# Patient Record
Sex: Male | Born: 1990 | Race: Black or African American | Hispanic: No | Marital: Single | Smoking: Current every day smoker
Health system: Southern US, Community
[De-identification: ages and names within clinical notes are randomized; demographics above are authoritative.]

## PROBLEM LIST (undated history)

## (undated) DIAGNOSIS — F329 Major depressive disorder, single episode, unspecified: Secondary | ICD-10-CM

## (undated) DIAGNOSIS — F23 Brief psychotic disorder: Secondary | ICD-10-CM

## (undated) DIAGNOSIS — J45909 Unspecified asthma, uncomplicated: Secondary | ICD-10-CM

---

## 2015-09-05 ENCOUNTER — Emergency Department (INDEPENDENT_AMBULATORY_CARE_PROVIDER_SITE_OTHER)
Admission: EM | Admit: 2015-09-05 | Discharge: 2015-09-05 | Disposition: A | Discharge status: Home / Self Care | Payer: Self-pay | Source: Home / Self Care

## 2015-09-05 ENCOUNTER — Encounter (HOSPITAL_COMMUNITY): Payer: Self-pay | Admitting: Emergency Medicine

## 2015-09-05 ENCOUNTER — Emergency Department (INDEPENDENT_AMBULATORY_CARE_PROVIDER_SITE_OTHER): Payer: Self-pay

## 2015-09-05 DIAGNOSIS — S8392XA Sprain of unspecified site of left knee, initial encounter: Secondary | ICD-10-CM

## 2015-09-05 HISTORY — DX: Unspecified asthma, uncomplicated: J45.909

## 2015-09-05 MED ORDER — IBUPROFEN 800 MG PO TABS
800.0000 mg | ORAL_TABLET | Freq: Once | ORAL | Status: AC
  Administered 2015-09-05: 800 mg via ORAL

## 2015-09-05 MED ORDER — IBUPROFEN 800 MG PO TABS
ORAL_TABLET | ORAL | Status: AC
  Filled 2015-09-05: qty 1

## 2015-09-05 NOTE — ED Notes (Signed)
Left knee pain and swelling.  Reports falling on concrete while skateboarding yesterday.

## 2015-09-05 NOTE — Discharge Instructions (Signed)
Knee Sprain °A knee sprain is a tear in the strong bands of tissue that connect the bones (ligaments) of your knee. °HOME CARE °· Raise (elevate) your injured knee to lessen puffiness (swelling). °· To ease pain and puffiness, put ice on the injured area. °¨ Put ice in a plastic bag. °¨ Place a towel between your skin and the bag. °¨ Leave the ice on for 20 minutes, 2-3 times a day. °· Only take medicine as told by your doctor. °· Do not leave your knee unprotected until pain and stiffness go away (usually 4-6 weeks). °· If you have a cast or splint, do not get it wet. If your doctor told you to not take it off, cover it with a plastic bag when you shower or bathe. Do not swim. °· Your doctor may have you do exercises to prevent or limit permanent weakness and stiffness. °GET HELP RIGHT AWAY IF:  °· Your cast or splint becomes damaged. °· Your pain gets worse. °· You have a lot of pain, puffiness, or numbness below the cast or splint. °MAKE SURE YOU:  °· Understand these instructions. °· Will watch your condition. °· Will get help right away if you are not doing well or get worse. °  °This information is not intended to replace advice given to you by your health care provider. Make sure you discuss any questions you have with your health care provider. °  °Document Released: 09/24/2009 Document Revised: 10/11/2013 Document Reviewed: 06/14/2013 °Elsevier Interactive Patient Education ©2016 Elsevier Inc. ° °

## 2015-09-05 NOTE — ED Provider Notes (Signed)
CSN: [id number]     Arrival date & time 09/05/15  1359 History   None    No chief complaint on file.  (Consider location/radiation/quality/duration/timing/severity/associated sxs/prior Treatment) HPI History obtained from patient:   LOCATION: left knee SEVERITY: DURATION:yesterday CONTEXT:fall while skating QUALITY: MODIFYING FACTORS:ice packs ASSOCIATED SYMPTOMS:none TIMING:constant OCCUPATION: student  No past medical history on file. No past surgical history on file. No family history on file. Social History  Substance Use Topics  . Smoking status: Not on file  . Smokeless tobacco: Not on file  . Alcohol Use: Not on file    Review of Systems ROS +'ve left knee pain  Denies: HEADACHE, NAUSEA, ABDOMINAL PAIN, CHEST PAIN, CONGESTION, DYSURIA, SHORTNESS OF BREATH  Allergies  Review of patient's allergies indicates not on file.  Home Medications   Prior to Admission medications   Not on File   Meds Ordered and Administered this Visit  Medications - No data to display  BP 110/58 mmHg  Pulse 71  Temp(Src) 97.9 F (36.6 C) (Oral)  Resp 16  SpO2 99% No data found.   Physical Exam  Musculoskeletal: Normal range of motion. He exhibits tenderness.       Left knee: He exhibits normal range of motion, no effusion, no erythema and no bony tenderness. Tenderness found. Medial joint line tenderness noted.  Nursing note and vitals reviewed.   ED Course  Procedures (including critical care time)  Labs Review Labs Reviewed - No data to display  Imaging Review Dg Knee Complete 4 Views Left  09/05/2015  CLINICAL DATA:  Pt fell 1 day ago onto left knee; He complains of pain medial left knee; Ambulatory with limp EXAM: LEFT KNEE - COMPLETE 4+ VIEW COMPARISON:  None. FINDINGS: No fracture of the proximal tibia or distal femur. Patella is normal. No joint effusion. IMPRESSION: No fracture or dislocation. Electronically Signed   By: Genevive BiStewart  Edmunds M.D.   On: 09/05/2015  16:14     Visual Acuity Review  Right Eye Distance:   Left Eye Distance:   Bilateral Distance:    Right Eye Near:   Left Eye Near:    Bilateral Near:         MDM   1. Knee sprain and strain, left, initial encounter    Independent review of the left knee x-ray does not reveal fracture. Suggest symptomatic treatment. I have applied a wrap to the knee. Crutches of care provider discharged home in stable condition.  THIS NOTE WAS GENERATED USING A VOICE RECOGNITION SOFTWARE PROGRAM. ALL REASONABLE EFFORTS  WERE MADE TO PROOFREAD THIS DOCUMENT FOR ACCURACY.     Tharon AquasFrank C Patrick, PA 09/05/15 [phone]

## 2015-09-12 ENCOUNTER — Emergency Department (HOSPITAL_COMMUNITY)
Admission: EM | Admit: 2015-09-12 | Discharge: 2015-09-12 | Disposition: A | Discharge status: Home / Self Care | Payer: Self-pay | Attending: Emergency Medicine | Admitting: Emergency Medicine

## 2015-09-12 ENCOUNTER — Encounter (HOSPITAL_COMMUNITY): Payer: Self-pay | Admitting: Emergency Medicine

## 2015-09-12 DIAGNOSIS — J45909 Unspecified asthma, uncomplicated: Secondary | ICD-10-CM | POA: Insufficient documentation

## 2015-09-12 DIAGNOSIS — M25562 Pain in left knee: Secondary | ICD-10-CM | POA: Insufficient documentation

## 2015-09-12 DIAGNOSIS — Z79899 Other long term (current) drug therapy: Secondary | ICD-10-CM | POA: Insufficient documentation

## 2015-09-12 DIAGNOSIS — R200 Anesthesia of skin: Secondary | ICD-10-CM | POA: Insufficient documentation

## 2015-09-12 MED ORDER — MELOXICAM 7.5 MG PO TABS: 7.5000 mg | ORAL_TABLET | Freq: Every day | ORAL | Status: DC

## 2015-09-12 NOTE — ED Notes (Signed)
Pt injured left knee while roller skating last week. [facility] , had neg xray and given Ibuprofen. Pt returns today for continued pain. Small scab on left knee w/o sx of infection. No other deformity.

## 2015-09-12 NOTE — ED Provider Notes (Signed)
CSN: [id number]     Arrival date & time 09/12/15  1121 History  By signing my name below, I, Freida Busman, attest that this documentation has been prepared under the direction and in the presence of non-physician practitioner, Trixie Dredge, PA-C. Electronically Signed: Freida Busman, Scribe. 09/12/2015. 1:17 PM.    Chief Complaint  Patient presents with  . Knee Pain   The history is provided by the patient. No language interpreter was used.    HPI Comments:  Sean Sanford is a 24 y.o. male who presents to the Emergency Department complaining of moderate continued left knee pain following injury ~ 1 week ago. He states he was skateboarding when he went off a curb.  His legs spread apart and the left knee bent inward. He was evaluated at urgent care following injury and had negative XR. He notes associated numbness below site which began after his urgent care visit. He has been taking aleve and ibuprofen without relief. He has also been wearing a brace which has provided little relief. He denies fever, weakness of his LLE and hip, ankle, or foot pain.   Past Medical History  Diagnosis Date  . Asthma    History reviewed. No pertinent past surgical history. No family history on file. Social History  Substance Use Topics  . Smoking status: Never Smoker   . Smokeless tobacco: None  . Alcohol Use: No    Review of Systems  Constitutional: Negative for fever and chills.  Respiratory: Negative for shortness of breath.   Cardiovascular: Negative for chest pain.  Musculoskeletal: Positive for myalgias and arthralgias.  Skin: Negative for color change and pallor.  Allergic/Immunologic: Negative for immunocompromised state.  Neurological: Positive for numbness. Negative for weakness.  Hematological: Does not bruise/bleed easily.   Allergies  Review of patient's allergies indicates no known allergies.  Home Medications   Prior to Admission medications   Medication Sig Start Date End Date  Taking? Authorizing Provider  albuterol (PROVENTIL HFA;VENTOLIN HFA) 108 (90 BASE) MCG/ACT inhaler Inhale into the lungs every 6 (six) hours as needed for wheezing or shortness of breath.    Historical Provider, MD  FLUoxetine HCl (PROZAC PO) Take by mouth.    Historical Provider, MD  meloxicam (MOBIC) 7.5 MG tablet Take 1 tablet (7.5 mg total) by mouth daily. 09/12/15   Trixie Dredge, PA-C   BP 131/77 mmHg  Pulse 67  Temp(Src) 97.6 F (36.4 C) (Oral)  Resp 18  SpO2 100% Physical Exam  Constitutional: He appears well-developed and well-nourished. No distress.  HENT:  Head: Normocephalic and atraumatic.  Neck: Neck supple.  Pulmonary/Chest: Effort normal.  Musculoskeletal:       Legs: Left calf non-tender No tenderness of left patella No erythema or warmth associated with joint Intact scab over medial aspect of left knee; no induration, fluctuance associated; no discharge No pain or laxity with valgus/varus stressing Mild edema to anterior left shin  LLE Extends to 170 degrees; flexes to 90 degrees  Able to ambulate with limp Compartments soft.  Thessaly test negative  Neurological: He is alert.  Skin: He is not diaphoretic.  Psychiatric: He has a normal mood and affect.  Nursing note and vitals reviewed.   ED Course  Procedures   DIAGNOSTIC STUDIES:  Oxygen Saturation is 99% on RA, normal by my interpretation.    COORDINATION OF CARE:  1:00 PM Discussed treatment plan with pt at bedside and pt agreed to plan.    MDM   Final diagnoses:  Left  knee pain    Afebrile, nontoxic patient with injury to his left knee while skateboarding and persistent pain x 1 week.   Xray performed at urgent care negative.  Unclear on testing what is causing his persistent pain.  D/C home with mobic, orthopedic follow up.  Discussed result, findings, treatment, and follow up  with patient.  Pt given return precautions.  Pt verbalizes understanding and agrees with plan.      I doubt any  other EMC precluding discharge at this time including, but not necessarily limited to the following: septic joint, occult fracture, compartment syndrome   I personally performed the services described in this documentation, which was scribed in my presence. The recorded information has been reviewed and is accurate.   Trixie Dredgemily Sharonda Llamas, PA-C 09/12/15 1607  Margarita Grizzleanielle Ray, MD 09/12/15 [phone]

## 2015-09-12 NOTE — Discharge Instructions (Signed)
Read the information below.  Use the prescribed medication as directed.  Please discuss all new medications with your pharmacist.  You may return to the Emergency Department at any time for worsening condition or any new symptoms that concern you.   Do not take aleve or ibuprofen while taking this medication.  If you develop uncontrolled pain, weakness or numbness of the extremity, severe discoloration of the skin, fevers, or you are unable to walk, return to the ER for a recheck.      Knee Pain Knee pain is a very common symptom and can have many causes. Knee pain often goes away when you follow your health care provider's instructions for relieving pain and discomfort at home. However, knee pain can develop into a condition that needs treatment. Some conditions may include:  Arthritis caused by wear and tear (osteoarthritis).  Arthritis caused by swelling and irritation (rheumatoid arthritis or gout).  A cyst or growth in your knee.  An infection in your knee joint.  An injury that will not heal.  Damage, swelling, or irritation of the tissues that support your knee (torn ligaments or tendinitis). If your knee pain continues, additional tests may be ordered to diagnose your condition. Tests may include X-rays or other imaging studies of your knee. You may also need to have fluid removed from your knee. Treatment for ongoing knee pain depends on the cause, but treatment may include:  Medicines to relieve pain or swelling.  Steroid injections in your knee.  Physical therapy.  Surgery. HOME CARE INSTRUCTIONS  Take medicines only as directed by your health care provider.  Rest your knee and keep it raised (elevated) while you are resting.  Do not do things that cause or worsen pain.  Avoid high-impact activities or exercises, such as running, jumping rope, or doing jumping jacks.  Apply ice to the knee area:  Put ice in a plastic bag.  Place a towel between your skin and the  bag.  Leave the ice on for 20 minutes, 2-3 times a day.  Ask your health care provider if you should wear an elastic knee support.  Keep a pillow under your knee when you sleep.  Lose weight if you are overweight. Extra weight can put pressure on your knee.  Do not use any tobacco products, including cigarettes, chewing tobacco, or electronic cigarettes. If you need help quitting, ask your health care provider. Smoking may slow the healing of any bone and joint problems that you may have. SEEK MEDICAL CARE IF:  Your knee pain continues, changes, or gets worse.  You have a fever along with knee pain.  Your knee buckles or locks up.  Your knee becomes more swollen. SEEK IMMEDIATE MEDICAL CARE IF:   Your knee joint feels hot to the touch.  You have chest pain or trouble breathing.   This information is not intended to replace advice given to you by your health care provider. Make sure you discuss any questions you have with your health care provider.   Document Released: 08/03/2007 Document Revised: 10/27/2014 Document Reviewed: 05/22/2014 Elsevier Interactive Patient Education [employer]2016 Elsevier Inc.

## 2016-11-09 ENCOUNTER — Emergency Department (HOSPITAL_COMMUNITY)
Admission: EM | Admit: 2016-11-09 | Discharge: 2016-11-09 | Disposition: A | Discharge status: Home / Self Care | Payer: Medicaid - Out of State | Attending: Emergency Medicine | Admitting: Emergency Medicine

## 2016-11-09 ENCOUNTER — Emergency Department (HOSPITAL_COMMUNITY): Payer: Medicaid - Out of State

## 2016-11-09 ENCOUNTER — Encounter (HOSPITAL_COMMUNITY): Payer: Self-pay | Admitting: Emergency Medicine

## 2016-11-09 DIAGNOSIS — N50811 Right testicular pain: Secondary | ICD-10-CM | POA: Insufficient documentation

## 2016-11-09 DIAGNOSIS — R1900 Intra-abdominal and pelvic swelling, mass and lump, unspecified site: Secondary | ICD-10-CM | POA: Insufficient documentation

## 2016-11-09 DIAGNOSIS — J45909 Unspecified asthma, uncomplicated: Secondary | ICD-10-CM | POA: Diagnosis not present

## 2016-11-09 DIAGNOSIS — R059 Cough, unspecified: Secondary | ICD-10-CM

## 2016-11-09 DIAGNOSIS — Z79899 Other long term (current) drug therapy: Secondary | ICD-10-CM | POA: Diagnosis not present

## 2016-11-09 DIAGNOSIS — R05 Cough: Secondary | ICD-10-CM | POA: Diagnosis not present

## 2016-11-09 DIAGNOSIS — R1909 Other intra-abdominal and pelvic swelling, mass and lump: Secondary | ICD-10-CM

## 2016-11-09 DIAGNOSIS — N50819 Testicular pain, unspecified: Secondary | ICD-10-CM

## 2016-11-09 LAB — CBC WITH DIFFERENTIAL/PLATELET
Basophils Absolute: 0 10*3/uL (ref 0.0–0.1)
Basophils Relative: 1 %
EOS ABS: 0.2 10*3/uL (ref 0.0–0.7)
Eosinophils Relative: 3 %
HCT: 39.5 % (ref 39.0–52.0)
HEMOGLOBIN: 13.8 g/dL (ref 13.0–17.0)
LYMPHS ABS: 3.3 10*3/uL (ref 0.7–4.0)
LYMPHS PCT: 65 %
MCH: 31.5 pg (ref 26.0–34.0)
MCHC: 34.9 g/dL (ref 30.0–36.0)
MCV: 90.2 fL (ref 78.0–100.0)
MONOS PCT: 10 %
Monocytes Absolute: 0.5 10*3/uL (ref 0.1–1.0)
Neutro Abs: 1.1 10*3/uL — ABNORMAL LOW (ref 1.7–7.7)
Neutrophils Relative %: 21 %
Platelets: 239 10*3/uL (ref 150–400)
RBC: 4.38 MIL/uL (ref 4.22–5.81)
RDW: 11.9 % (ref 11.5–15.5)
WBC: 5 10*3/uL (ref 4.0–10.5)

## 2016-11-09 LAB — URINALYSIS, ROUTINE W REFLEX MICROSCOPIC
BILIRUBIN URINE: NEGATIVE
Bacteria, UA: NONE SEEN
GLUCOSE, UA: NEGATIVE mg/dL
Hgb urine dipstick: NEGATIVE
Ketones, ur: NEGATIVE mg/dL
LEUKOCYTES UA: NEGATIVE
NITRITE: NEGATIVE
PH: 5 (ref 5.0–8.0)
Protein, ur: 30 mg/dL — AB
SPECIFIC GRAVITY, URINE: 1.028 (ref 1.005–1.030)
Squamous Epithelial / LPF: NONE SEEN

## 2016-11-09 LAB — COMPREHENSIVE METABOLIC PANEL
ALK PHOS: 46 U/L (ref 38–126)
ALT: 33 U/L (ref 17–63)
ANION GAP: 9 (ref 5–15)
AST: 40 U/L (ref 15–41)
Albumin: 4.1 g/dL (ref 3.5–5.0)
BILIRUBIN TOTAL: 0.9 mg/dL (ref 0.3–1.2)
BUN: 16 mg/dL (ref 6–20)
CALCIUM: 9.6 mg/dL (ref 8.9–10.3)
CO2: 25 mmol/L (ref 22–32)
CREATININE: 1.16 mg/dL (ref 0.61–1.24)
Chloride: 105 mmol/L (ref 101–111)
Glucose, Bld: 86 mg/dL (ref 65–99)
Potassium: 3.7 mmol/L (ref 3.5–5.1)
SODIUM: 139 mmol/L (ref 135–145)
TOTAL PROTEIN: 6.9 g/dL (ref 6.5–8.1)

## 2016-11-09 MED ORDER — ALBUTEROL SULFATE HFA 108 (90 BASE) MCG/ACT IN AERS: 2.0000 | INHALATION_SPRAY | RESPIRATORY_TRACT | 0 refills | Status: DC | PRN

## 2016-11-09 MED ORDER — PREDNISONE 10 MG (21) PO TBPK: ORAL_TABLET | ORAL | 0 refills | Status: DC

## 2016-11-09 MED ORDER — BENZONATATE 100 MG PO CAPS: 100.0000 mg | ORAL_CAPSULE | Freq: Three times a day (TID) | ORAL | 0 refills | Status: DC

## 2016-11-09 MED ORDER — IBUPROFEN 800 MG PO TABS
800.0000 mg | ORAL_TABLET | Freq: Three times a day (TID) | ORAL | 0 refills | Status: DC
Start: 2016-11-09 — End: 2019-04-06

## 2016-11-09 NOTE — Discharge Instructions (Signed)
There are no abnormalities noted on the scrotal ultrasound. The mass noted on exam will need to be followed up with by urology. Call the number provided to set up an appointment. Ibuprofen, naproxen, or Tylenol for pain.  For your cough, Tessalon should provide some comfort. Take the prednisone as directed. Use the albuterol inhaler as needed.

## 2016-11-09 NOTE — ED Triage Notes (Signed)
Patient reports right testicular pain with mild swelling onset this week , denies injury , no dysuria or fever .

## 2016-11-09 NOTE — ED Notes (Signed)
Pt to ultrasound

## 2016-11-09 NOTE — ED Notes (Signed)
Called patient from waiting room.  Tech 1st stated that he was taken to xray, she then called portable xray # and requested patient be taken to B19 upon their return.

## 2016-11-09 NOTE — ED Provider Notes (Signed)
[facility]-EMERGENCY DEPT Provider Note   CSN: [id number] Arrival date & time: 11/09/16  0051     History   Chief Complaint Chief Complaint  Patient presents with  . Testicle Pain    HPI Sean Sanford is a 26 y.o. male.  HPI   Sean Sanford is a 26 y.o. male, with a history of Asthma, presenting to the ED with swelling to the right side of his genitals that began 2-3 days ago. Swelling came on gradually. Has not happened before. Denies pain in the region.  Patient also complains of some increased wheezing and cough over the past few days. Has been using his albuterol inhaler with relief. Denies shortness of breath and chest pain.  Denies fever/chills, penile discharge, testicular pain, urinary complaints, abdominal pain, or any other complaints.    Past Medical History:  Diagnosis Date  . Asthma     There are no active problems to display for this patient.   History reviewed. No pertinent surgical history.     Home Medications    Prior to Admission medications   Medication Sig Start Date End Date Taking? Authorizing Provider  albuterol (PROVENTIL HFA;VENTOLIN HFA) 108 (90 BASE) MCG/ACT inhaler Inhale into the lungs every 6 (six) hours as needed for wheezing or shortness of breath.    Historical Provider, MD  albuterol (PROVENTIL HFA;VENTOLIN HFA) 108 (90 Base) MCG/ACT inhaler Inhale 2 puffs into the lungs every 4 (four) hours as needed for wheezing or shortness of breath. 11/09/16   Myka Lukins C Dnasia Gauna, PA-C  benzonatate (TESSALON) 100 MG capsule Take 1 capsule (100 mg total) by mouth every 8 (eight) hours. 11/09/16   Manuelita Moxon C Shawntell Dixson, PA-C  FLUoxetine HCl (PROZAC PO) Take by mouth.    Historical Provider, MD  ibuprofen (ADVIL,MOTRIN) 800 MG tablet Take 1 tablet (800 mg total) by mouth 3 (three) times daily. 11/09/16   Babygirl Trager C Gustaf Mccarter, PA-C  meloxicam (MOBIC) 7.5 MG tablet Take 1 tablet (7.5 mg total) by mouth daily. 09/12/15   Trixie Dredge, PA-C  predniSONE (STERAPRED UNI-PAK 21 TAB) 10 MG  (21) TBPK tablet Take 6 tabs day 1, 5 tabs day 2, 4 tabs day 3, 3 tabs day 4, 2 tabs day 5, and 1 tab on day 6. 11/09/16   Anselm Pancoast, PA-C    Family History No family history on file.  Social History Social History  Substance Use Topics  . Smoking status: Never Smoker  . Smokeless tobacco: Never Used  . Alcohol use No     Allergies   Patient has no known allergies.   Review of Systems Review of Systems  Constitutional: Negative for chills and fever.  Respiratory: Positive for cough and wheezing. Negative for shortness of breath.   Cardiovascular: Negative for chest pain.  Gastrointestinal: Negative for abdominal pain, nausea and vomiting.  Genitourinary: Negative for flank pain, hematuria, penile pain and penile swelling.       Groin swelling  All other systems reviewed and are negative.    Physical Exam Updated Vital Signs BP 124/90 (BP Location: Right Arm)   Pulse 85   Temp 97.5 F (36.4 C) (Oral)   Resp 16   Ht 5\' 11"  (1.803 m)   Wt 73.9 kg   SpO2 100%   BMI 22.73 kg/m   Physical Exam  Constitutional: He appears well-developed and well-nourished. No distress.  HENT:  Head: Normocephalic and atraumatic.  Eyes: Conjunctivae are normal.  Neck: Neck supple.  Cardiovascular: Normal rate, regular rhythm, normal heart  sounds and intact distal pulses.   Pulmonary/Chest: Effort normal. No respiratory distress. He has wheezes.  No increased work of breathing. Patient speaks in full sentences without difficulty.  Abdominal: Soft. There is no tenderness. There is no guarding.  Genitourinary:  Genitourinary Comments: Small, firm mass to the right of the base of the penis. No fluctuance. Penis, scrotum, and testicles without swelling, lesions, or tenderness. No penile discharge.  No inguinal hernia noted. Cremasteric reflex intact. Otherwise normal male genitalia.   Musculoskeletal: He exhibits no edema.  Lymphadenopathy:    He has no cervical adenopathy.    Neurological: He is alert.  Skin: Skin is warm and dry. He is not diaphoretic.  Psychiatric: He has a normal mood and affect. His behavior is normal.  Nursing note and vitals reviewed.    ED Treatments / Results  Labs (all labs ordered are listed, but only abnormal results are displayed) Labs Reviewed  CBC WITH DIFFERENTIAL/PLATELET - Abnormal; Notable for the following:       Result Value   Neutro Abs 1.1 (*)    All other components within normal limits  URINALYSIS, ROUTINE W REFLEX MICROSCOPIC - Abnormal; Notable for the following:    Protein, ur 30 (*)    All other components within normal limits  COMPREHENSIVE METABOLIC PANEL    EKG  EKG Interpretation None       Radiology US Scrotum  Result Date: 11/09/2016 CLINICAL DATA:  Right testicular pain for 2 days. EXAM: SCROTAL ULTRASOUND DOPPLER ULTRASOUND OF THE TESTICLES TECHNIQUE: Complete ultrasound examination of the testicles, epididymis, and other scrotal structures was performed. Color and spectral Doppler ultrasound were also utilized to evaluate blood flow to the testicles. COMPARISON:  None. FINDINGS: Right testicle Measurements: 5.1 x 2.2 x 3.1 cm. No mass or microlithiasis visualized. There is normal blood flow. Left testicle Measurements: 3.9 x 2.9 x 3.2 cm. No mass or microlithiasis visualized. There is normal blood flow. Right epididymis:  Normal in size and appearance. Left epididymis:  Normal in size and appearance. Hydrocele:  None visualized. Varicocele:  None visualized. Pulsed Doppler interrogation of both testes demonstrates normal low resistance arterial and venous waveforms bilaterally. IMPRESSION: Normal scrotal ultrasound with Doppler. Electronically Signed   By: Rubye Oaks M.D.   On: 11/09/2016 03:46   [place] Art/ven Flow Abd Pelv Doppler  Result Date: 11/09/2016 CLINICAL DATA:  Right testicular pain for 2 days. EXAM: SCROTAL ULTRASOUND DOPPLER ULTRASOUND OF THE TESTICLES TECHNIQUE: Complete ultrasound  examination of the testicles, epididymis, and other scrotal structures was performed. Color and spectral Doppler ultrasound were also utilized to evaluate blood flow to the testicles. COMPARISON:  None. FINDINGS: Right testicle Measurements: 5.1 x 2.2 x 3.1 cm. No mass or microlithiasis visualized. There is normal blood flow. Left testicle Measurements: 3.9 x 2.9 x 3.2 cm. No mass or microlithiasis visualized. There is normal blood flow. Right epididymis:  Normal in size and appearance. Left epididymis:  Normal in size and appearance. Hydrocele:  None visualized. Varicocele:  None visualized. Pulsed Doppler interrogation of both testes demonstrates normal low resistance arterial and venous waveforms bilaterally. IMPRESSION: Normal scrotal ultrasound with Doppler. Electronically Signed   By: Rubye Oaks M.D.   On: 11/09/2016 03:46    Procedures Procedures (including critical care time)  Medications Ordered in ED Medications - No data to display   Initial Impression / Assessment and Plan / ED Course  I have reviewed the triage vital signs and the nursing notes.  Pertinent labs & imaging  results that were available during my care of the patient were reviewed by me and considered in my medical decision making (see chart for details).     Patient presents with a small area of swelling to the right of the penis. No abnormalities found to the penis, testicles, or scrotum. Isolated lymphadenopathy possible. PCP versus urology follow-up. Return precautions discussed.  Patient's cough was also treated. Shows no increased work of breathing and maintains SPO2 of 100% on room air.  Vitals:   11/09/16 0054 11/09/16 0056 11/09/16 0058  BP: 124/90    Pulse: 85    Resp: 16    Temp: 97.5 F (36.4 C)    TempSrc: Oral    SpO2: 100%    Weight:  73.9 kg 73.9 kg  Height:  5\' 11"  (1.803 m) 5\' 11"  (1.803 m)      Final Clinical Impressions(s) / ED Diagnoses   Final diagnoses:  Testicle pain  Groin  mass  Cough    New Prescriptions Discharge Medication List as of 11/09/2016  4:06 AM    START taking these medications   Details  !! albuterol (PROVENTIL HFA;VENTOLIN HFA) 108 (90 Base) MCG/ACT inhaler Inhale 2 puffs into the lungs every 4 (four) hours as needed for wheezing or shortness of breath., Starting Sun 11/09/2016, Print    benzonatate (TESSALON) 100 MG capsule Take 1 capsule (100 mg total) by mouth every 8 (eight) hours., Starting Sun 11/09/2016, Print    ibuprofen (ADVIL,MOTRIN) 800 MG tablet Take 1 tablet (800 mg total) by mouth 3 (three) times daily., Starting Sun 11/09/2016, Print    predniSONE (STERAPRED UNI-PAK 21 TAB) 10 MG (21) TBPK tablet Take 6 tabs day 1, 5 tabs day 2, 4 tabs day 3, 3 tabs day 4, 2 tabs day 5, and 1 tab on day 6., Print     !! - Potential duplicate medications found. Please discuss with provider.       Anselm PancoastShawn C Dmarco Baldus, PA-C 11/09/16 [phone]    Glynn OctaveStephen Rancour, MD 11/09/16 [phone]

## 2017-01-18 ENCOUNTER — Encounter (HOSPITAL_COMMUNITY): Payer: Self-pay | Admitting: *Deleted

## 2017-01-18 ENCOUNTER — Emergency Department (HOSPITAL_COMMUNITY)
Admission: EM | Admit: 2017-01-18 | Discharge: 2017-01-18 | Disposition: A | Discharge status: Home / Self Care | Payer: Medicaid - Out of State | Attending: Emergency Medicine | Admitting: Emergency Medicine

## 2017-01-18 DIAGNOSIS — J45909 Unspecified asthma, uncomplicated: Secondary | ICD-10-CM | POA: Insufficient documentation

## 2017-01-18 DIAGNOSIS — J020 Streptococcal pharyngitis: Secondary | ICD-10-CM | POA: Insufficient documentation

## 2017-01-18 LAB — RAPID STREP SCREEN (MED CTR MEBANE ONLY): STREPTOCOCCUS, GROUP A SCREEN (DIRECT): POSITIVE — AB

## 2017-01-18 MED ORDER — ACETAMINOPHEN 325 MG PO TABS
ORAL_TABLET | ORAL | Status: AC
  Filled 2017-01-18: qty 1

## 2017-01-18 MED ORDER — ACETAMINOPHEN 325 MG PO TABS
650.0000 mg | ORAL_TABLET | Freq: Once | ORAL | Status: AC
Start: 2017-01-18 — End: 2017-01-18
  Administered 2017-01-18: 650 mg via ORAL

## 2017-01-18 MED ORDER — PENICILLIN G BENZATHINE 1200000 UNIT/2ML IM SUSP
1.2000 10*6.[IU] | Freq: Once | INTRAMUSCULAR | Status: AC
  Administered 2017-01-18: 1.2 10*6.[IU] via INTRAMUSCULAR
  Filled 2017-01-18: qty 2

## 2017-01-18 NOTE — ED Notes (Signed)
Declined W/C at D/C and was escorted to lobby by RN. 

## 2017-01-18 NOTE — ED Triage Notes (Signed)
Pt reports sore throat, fevers, back pain for 2 days.

## 2017-01-18 NOTE — ED Provider Notes (Signed)
[facility]-EMERGENCY DEPT Provider Note   CSN: [id number] Arrival date & time: 01/18/17  1634   By signing my name below, I, Sean Sanford, attest that this documentation has been prepared under the direction and in the presence of  [employer]. Electronically Signed: Clovis Sanford, ED Scribe. 01/18/17. 5:42 PM.   History   Chief Complaint Chief Complaint  Patient presents with  . Fever    HPI Comments:  Sean Sanford is a 26 y.o. male, with a PMHx of asthma, who presents to the Emergency Department complaining of acute onset, persistent sore throat x 2 days. Pt also reports fevers (tmax 103) and headaches.  No alleviating or modifying factors noted. Pt states today's symptoms are not consistent with the symptoms he experiences with his asthma exacerbations. Pt denies congestion, rhinorrhea, chest tightness, cough, SOB, wheezing, diarrhea, nausea, vomiting, abdominal pain or any other associated symptoms. No other complaints noted. No known sick contacts with similar symptoms.   The history is provided by the patient. No language interpreter was used.    Past Medical History:  Diagnosis Date  . Asthma     There are no active problems to display for this patient.   History reviewed. No pertinent surgical history.   Home Medications    Prior to Admission medications   Medication Sig Start Date End Date Taking? Authorizing Provider  albuterol (PROVENTIL HFA;VENTOLIN HFA) 108 (90 BASE) MCG/ACT inhaler Inhale into the lungs every 6 (six) hours as needed for wheezing or shortness of breath.    Historical Provider, MD  albuterol (PROVENTIL HFA;VENTOLIN HFA) 108 (90 Base) MCG/ACT inhaler Inhale 2 puffs into the lungs every 4 (four) hours as needed for wheezing or shortness of breath. 11/09/16   Shawn C Joy, PA-C  benzonatate (TESSALON) 100 MG capsule Take 1 capsule (100 mg total) by mouth every 8 (eight) hours. 11/09/16   Shawn C Joy, PA-C  FLUoxetine HCl (PROZAC PO) Take by mouth.     Historical Provider, MD  ibuprofen (ADVIL,MOTRIN) 800 MG tablet Take 1 tablet (800 mg total) by mouth 3 (three) times daily. 11/09/16   Shawn C Joy, PA-C  meloxicam (MOBIC) 7.5 MG tablet Take 1 tablet (7.5 mg total) by mouth daily. 09/12/15   Trixie Dredge, PA-C  predniSONE (STERAPRED UNI-PAK 21 TAB) 10 MG (21) TBPK tablet Take 6 tabs day 1, 5 tabs day 2, 4 tabs day 3, 3 tabs day 4, 2 tabs day 5, and 1 tab on day 6. 11/09/16   Anselm Pancoast, PA-C    Family History No family history on file.  Social History Social History  Substance Use Topics  . Smoking status: Never Smoker  . Smokeless tobacco: Never Used  . Alcohol use No     Allergies   Patient has no known allergies.   Review of Systems Review of Systems  Constitutional: Positive for fever.  HENT: Positive for sore throat. Negative for congestion and rhinorrhea.   Respiratory: Negative for cough, chest tightness, shortness of breath and wheezing.   Gastrointestinal: Negative for abdominal pain, constipation, diarrhea, nausea and vomiting.  Neurological: Positive for headaches.    Physical Exam Updated Vital Signs BP 103/81 (BP Location: Right Arm)   Pulse 84   Temp (!) 103 F (39.4 C) (Oral)   Resp 18   SpO2 98%   Physical Exam  Constitutional: He is oriented to person, place, and time. He appears well-developed and well-nourished. No distress.  HENT:  Head: Normocephalic and atraumatic.  Nose: Mucosal  edema (mild) present.  Mouth/Throat: Posterior oropharyngeal erythema present. Tonsillar exudate.  +Tonsillar edema with bilateral exudate.  +Posterior oropharyngeal erythema without edema or petechiae.  No mucosal edema, clear nasal discharge noted.  No facial or anterior neck edema, erythema or erythema. No sublingual edema or tenderness.  Soft palate flat without tenderness.  No trismus.   No pooling of oral secretions.  Phonation normal, no hot potato voice.  Maxilla and mandible nontender. Mastoids without  edema, erythema or tenderness.    Eyes: Conjunctivae are normal.  Cardiovascular: Normal rate.   Pulmonary/Chest: Effort normal.  Abdominal: Soft. He exhibits no distension. There is no hepatosplenomegaly. There is no tenderness.  Lymphadenopathy:       Head (right side): Submandibular adenopathy present.  Right submandibular lymphadenopathy, tender  Neurological: He is alert and oriented to person, place, and time.  Skin: Skin is warm and dry.  Psychiatric: He has a normal mood and affect.  Nursing note and vitals reviewed.    ED Treatments / Results  DIAGNOSTIC STUDIES:  Oxygen Saturation is 98% on RA, normal by my interpretation.    COORDINATION OF CARE:  5:40 PM Discussed treatment plan with pt at bedside and pt agreed to plan.  Labs (all labs ordered are listed, but only abnormal results are displayed) Labs Reviewed  RAPID STREP SCREEN (NOT AT [facility]) - Abnormal; Notable for the following:       Result Value   Streptococcus, Group A Screen (Direct) POSITIVE (*)    All other components within normal limits    EKG  EKG Interpretation None       Radiology No results found.  Procedures Procedures (including critical care time)  Medications Ordered in ED Medications  acetaminophen (TYLENOL) 325 MG tablet (not administered)  penicillin g benzathine (BICILLIN LA) 1200000 UNIT/2ML injection 1.2 Million Units (not administered)  acetaminophen (TYLENOL) tablet 650 mg (650 mg Oral Given 01/18/17 1705)     Initial Impression / Assessment and Plan / ED Course  I have reviewed the triage vital signs and the nursing notes.  Pertinent labs & imaging results that were available during my care of the patient were reviewed by me and considered in my medical decision making (see chart for details).  Clinical Course as of Jan 18 1806  Sun Jan 18, 2017  1757 Temp: (!) 103 F (39.4 C) [CG]  1800 Resp: 18 [CG]  1800 SpO2: 98 % [CG]  1800 Pulse Rate: 84 [CG]  1800  Streptococcus, Group A Screen (Direct): (!) POSITIVE [CG]    Clinical Course User Index [CG] Liberty Handy, PA-C    Pt rapid strep test positive. Pt is tolerating secretions. Presentation not concerning for peritonsillar abscess or spread of infection to deep spaces of the throat; patent airway. Pt will be treated with penicillin IM. Specific return precautions discussed. Recommended PCP follow up. Pt appears safe for discharge.    Final Clinical Impressions(s) / ED Diagnoses   Final diagnoses:  Strep pharyngitis    New Prescriptions New Prescriptions   No medications on file   I personally performed the services described in this documentation, which was scribed in my presence. The recorded information has been reviewed and is accurate.     Liberty Handy, PA-C 01/18/17 1807    Lavera Guise, MD 01/18/17 Mikle Bosworth

## 2017-01-18 NOTE — Discharge Instructions (Signed)
You tested positive for strep throat. You received an antibiotic injection today to treat this infection. No further treatment is necessary. You can continue taking 600 mg of ibuprofen for fever and sore throat. Monitor your symptoms and ensure that they are resolving. Return to the emergency department if you notice throat swelling, difficulty swallowing saliva, neck rigidity or pain or changes in voice.

## 2019-02-06 ENCOUNTER — Other Ambulatory Visit: Payer: Self-pay

## 2019-02-06 ENCOUNTER — Emergency Department (HOSPITAL_COMMUNITY)
Admission: EM | Admit: 2019-02-06 | Discharge: 2019-02-06 | Disposition: A | Discharge status: Home / Self Care | Payer: Medicaid - Out of State | Attending: Emergency Medicine | Admitting: Emergency Medicine

## 2019-02-06 ENCOUNTER — Encounter (HOSPITAL_COMMUNITY): Payer: Self-pay

## 2019-02-06 DIAGNOSIS — K2900 Acute gastritis without bleeding: Secondary | ICD-10-CM | POA: Diagnosis not present

## 2019-02-06 DIAGNOSIS — R1013 Epigastric pain: Secondary | ICD-10-CM | POA: Diagnosis present

## 2019-02-06 DIAGNOSIS — J45909 Unspecified asthma, uncomplicated: Secondary | ICD-10-CM | POA: Insufficient documentation

## 2019-02-06 DIAGNOSIS — Z79899 Other long term (current) drug therapy: Secondary | ICD-10-CM | POA: Insufficient documentation

## 2019-02-06 LAB — URINALYSIS, ROUTINE W REFLEX MICROSCOPIC
Bilirubin Urine: NEGATIVE
Glucose, UA: NEGATIVE mg/dL
Hgb urine dipstick: NEGATIVE
Ketones, ur: NEGATIVE mg/dL
Leukocytes,Ua: NEGATIVE
Nitrite: NEGATIVE
Protein, ur: NEGATIVE mg/dL
Specific Gravity, Urine: 1.01 (ref 1.005–1.030)
pH: 5 (ref 5.0–8.0)

## 2019-02-06 LAB — COMPREHENSIVE METABOLIC PANEL
ALT: 31 U/L (ref 0–44)
AST: 34 U/L (ref 15–41)
Albumin: 3.8 g/dL (ref 3.5–5.0)
Alkaline Phosphatase: 38 U/L (ref 38–126)
Anion gap: 7 (ref 5–15)
BUN: 8 mg/dL (ref 6–20)
CO2: 26 mmol/L (ref 22–32)
Calcium: 9.1 mg/dL (ref 8.9–10.3)
Chloride: 105 mmol/L (ref 98–111)
Creatinine, Ser: 1.23 mg/dL (ref 0.61–1.24)
GFR calc Af Amer: >60 mL/min (ref >60)
GFR calc non Af Amer: >60 mL/min (ref >60)
Glucose, Bld: 90 mg/dL (ref 70–99)
Potassium: 4.1 mmol/L (ref 3.5–5.1)
Sodium: 138 mmol/L (ref 135–145)
Total Bilirubin: 0.9 mg/dL (ref 0.3–1.2)
Total Protein: 6.2 g/dL — ABNORMAL LOW (ref 6.5–8.1)

## 2019-02-06 LAB — CBC WITH DIFFERENTIAL/PLATELET
Abs Immature Granulocytes: 0.01 10*3/uL (ref 0.00–0.07)
Basophils Absolute: 0 10*3/uL (ref 0.0–0.1)
Basophils Relative: 1 %
Eosinophils Absolute: 0.2 10*3/uL (ref 0.0–0.5)
Eosinophils Relative: 4 %
HCT: 39.3 % (ref 39.0–52.0)
Hemoglobin: 13.1 g/dL (ref 13.0–17.0)
Immature Granulocytes: 0 %
Lymphocytes Relative: 36 %
Lymphs Abs: 1.5 10*3/uL (ref 0.7–4.0)
MCH: 31.1 pg (ref 26.0–34.0)
MCHC: 33.3 g/dL (ref 30.0–36.0)
MCV: 93.3 fL (ref 80.0–100.0)
Monocytes Absolute: 0.5 10*3/uL (ref 0.1–1.0)
Monocytes Relative: 11 %
Neutro Abs: 2.1 10*3/uL (ref 1.7–7.7)
Neutrophils Relative %: 48 %
Platelets: 205 10*3/uL (ref 150–400)
RBC: 4.21 MIL/uL — ABNORMAL LOW (ref 4.22–5.81)
RDW: 11.2 % — ABNORMAL LOW (ref 11.5–15.5)
WBC: 4.3 10*3/uL (ref 4.0–10.5)
nRBC: 0.5 % — ABNORMAL HIGH (ref 0.0–0.2)

## 2019-02-06 LAB — RAPID URINE DRUG SCREEN, HOSP PERFORMED
Amphetamines: NOT DETECTED
Barbiturates: NOT DETECTED
Benzodiazepines: NOT DETECTED
Cocaine: POSITIVE — AB
Opiates: NOT DETECTED
Tetrahydrocannabinol: POSITIVE — AB

## 2019-02-06 LAB — LIPASE, BLOOD: Lipase: 23 U/L (ref 11–51)

## 2019-02-06 MED ORDER — ALUM & MAG HYDROXIDE-SIMETH 200-200-20 MG/5ML PO SUSP
30.0000 mL | Freq: Once | ORAL | Status: AC
  Administered 2019-02-06: 30 mL via ORAL
  Filled 2019-02-06: qty 30

## 2019-02-06 MED ORDER — SUCRALFATE 1 GM/10ML PO SUSP: 1.0000 g | Freq: Four times a day (QID) | ORAL | 1 refills | Status: DC

## 2019-02-06 NOTE — ED Notes (Signed)
Pt states he is hoping to get, "Ultrasounds or something."  Pt is concerned for internal bleeding. Denies black tarry stools as well as BRBPR or coffee ground emesis.

## 2019-02-06 NOTE — Discharge Instructions (Signed)
Avoid ibuprofen.  Stop taking xanax.

## 2019-02-06 NOTE — ED Triage Notes (Addendum)
Pt arrives POV from home for abdominal pain x2 days. Pt reports currently 4/10, but it is intermittent. Pt took 3 xanax and around 10 ibuprofen. Pt denies SI/HI.  Pt also reports was on xanax last week and walking around the apartment and passed out and hit his head. Pt endorses HA since.

## 2019-02-06 NOTE — ED Provider Notes (Signed)
[facility] EMERGENCY DEPARTMENT Provider Note   CSN: [id number] Arrival date & time: 02/06/19  1851    History   Chief Complaint Chief Complaint  Patient presents with  . Abdominal Pain  . Headache    HPI Sean Sanford is a 28 y.o. male.     The history is provided by the patient. No language interpreter was used.  Abdominal Pain  Pain location:  Epigastric Pain quality: aching   Pain radiates to:  Does not radiate Progression:  Worsening Chronicity:  New Context: not alcohol use   Relieved by:  Nothing Worsened by:  Nothing Ineffective treatments:  None tried Associated symptoms: nausea   Associated symptoms: no constipation   Risk factors: NSAID use   Headache  Associated symptoms: abdominal pain and nausea   Pt reports he took to much xanax and then took 10 ibuprofen 2 days ago.  Pt complains of an upset stomach   Past Medical History:  Diagnosis Date  . Asthma     There are no active problems to display for this patient.   History reviewed. No pertinent surgical history.      Home Medications    Prior to Admission medications   Medication Sig Start Date End Date Taking? Authorizing Provider  albuterol (PROVENTIL HFA;VENTOLIN HFA) 108 (90 BASE) MCG/ACT inhaler Inhale into the lungs every 6 (six) hours as needed for wheezing or shortness of breath.    [provider]  albuterol (PROVENTIL HFA;VENTOLIN HFA) 108 (90 Base) MCG/ACT inhaler Inhale 2 puffs into the lungs every 4 (four) hours as needed for wheezing or shortness of breath. 11/09/16   Joy, Shawn C, PA-C  benzonatate (TESSALON) 100 MG capsule Take 1 capsule (100 mg total) by mouth every 8 (eight) hours. 11/09/16   Joy, Shawn C, PA-C  FLUoxetine HCl (PROZAC PO) Take by mouth.    [provider]  ibuprofen (ADVIL,MOTRIN) 800 MG tablet Take 1 tablet (800 mg total) by mouth 3 (three) times daily. 11/09/16   Joy, Shawn C, PA-C  meloxicam (MOBIC) 7.5 MG tablet Take 1  tablet (7.5 mg total) by mouth daily. 09/12/15   Trixie Dredge, PA-C  predniSONE (STERAPRED UNI-PAK 21 TAB) 10 MG (21) TBPK tablet Take 6 tabs day 1, 5 tabs day 2, 4 tabs day 3, 3 tabs day 4, 2 tabs day 5, and 1 tab on day 6. 11/09/16   Joy, Shawn C, PA-C  sucralfate (CARAFATE) 1 GM/10ML suspension Take 10 mLs (1 g total) by mouth 4 (four) times daily. 02/06/19 02/06/20  Elson Areas, PA-C    Family History History reviewed. No pertinent family history.  Social History Social History   Tobacco Use  . Smoking status: Never Smoker  . Smokeless tobacco: Never Used  Substance Use Topics  . Alcohol use: No  . Drug use: No     Allergies   Patient has no known allergies.   Review of Systems Review of Systems  Gastrointestinal: Positive for abdominal pain and nausea. Negative for constipation.  Neurological: Positive for headaches.  All other systems reviewed and are negative.    Physical Exam Updated Vital Signs BP (!) 127/58 (BP Location: Right Arm)   Pulse 60   Temp 98.1 F (36.7 C) (Oral)   Resp 16   SpO2 100%   Physical Exam Vitals signs and nursing note reviewed.  Constitutional:      Appearance: He is well-developed.  HENT:     Head: Normocephalic.  Mouth/Throat:     Mouth: Mucous membranes are moist.  Eyes:     Extraocular Movements: Extraocular movements intact.  Neck:     Musculoskeletal: Normal range of motion.  Cardiovascular:     Rate and Rhythm: Normal rate.  Pulmonary:     Effort: Pulmonary effort is normal.  Abdominal:     General: There is no distension.     Palpations: Abdomen is soft.     Tenderness: There is abdominal tenderness in the epigastric area.  Musculoskeletal: Normal range of motion.  Skin:    General: Skin is warm.  Neurological:     General: No focal deficit present.     Mental Status: He is alert and oriented to person, place, and time.  Psychiatric:        Mood and Affect: Mood normal.      ED Treatments / Results   Labs (all labs ordered are listed, but only abnormal results are displayed) Labs Reviewed  CBC WITH DIFFERENTIAL/PLATELET - Abnormal; Notable for the following components:      Result Value   RBC 4.21 (*)    RDW 11.2 (*)    nRBC 0.5 (*)    All other components within normal limits  COMPREHENSIVE METABOLIC PANEL - Abnormal; Notable for the following components:   Total Protein 6.2 (*)    All other components within normal limits  RAPID URINE DRUG SCREEN, HOSP PERFORMED - Abnormal; Notable for the following components:   Cocaine POSITIVE (*)    Tetrahydrocannabinol POSITIVE (*)    All other components within normal limits  LIPASE, BLOOD  URINALYSIS, ROUTINE W REFLEX MICROSCOPIC    EKG None  Radiology No results found.  Procedures Procedures (including critical care time)  Medications Ordered in ED Medications  alum & mag hydroxide-simeth (MAALOX/MYLANTA) 200-200-20 MG/5ML suspension 30 mL (30 mLs Oral Given 02/06/19 1959)     Initial Impression / Assessment and Plan / ED Course  I have reviewed the triage vital signs and the nursing notes.  Pertinent labs & imaging results that were available during my care of the patient were reviewed by me and considered in my medical decision making (see chart for details).        Pt denies suicidal or homicidal thoughts.  No overdose intent.  Pt counseled on substance abuse.  I suspect pt has gastitis due to ibuprofen.  Labs are normal.  Pt given rx for carafate.    Final Clinical Impressions(s) / ED Diagnoses   Final diagnoses:  Acute gastritis without hemorrhage, unspecified gastritis type    ED Discharge Orders         Ordered    sucralfate (CARAFATE) 1 GM/10ML suspension  4 times daily     02/06/19 2126        An After Visit Summary was printed and given to the patient.    Osie CheeksSofia, Leslie K, PA-C 02/06/19 2228    Eber HongMiller, Brian, MD 02/07/19 [phone]

## 2019-04-05 ENCOUNTER — Inpatient Hospital Stay (HOSPITAL_COMMUNITY)
Admission: RE | Admit: 2019-04-05 | Discharge: 2019-04-07 | DRG: 885 | Disposition: A | Discharge status: Home / Self Care | Payer: Federal, State, Local not specified - Other | Attending: Psychiatry | Admitting: Psychiatry

## 2019-04-05 DIAGNOSIS — G4700 Insomnia, unspecified: Secondary | ICD-10-CM | POA: Diagnosis present

## 2019-04-05 DIAGNOSIS — Z1159 Encounter for screening for other viral diseases: Secondary | ICD-10-CM

## 2019-04-05 DIAGNOSIS — J45909 Unspecified asthma, uncomplicated: Secondary | ICD-10-CM | POA: Diagnosis present

## 2019-04-05 DIAGNOSIS — F333 Major depressive disorder, recurrent, severe with psychotic symptoms: Principal | ICD-10-CM | POA: Diagnosis present

## 2019-04-05 DIAGNOSIS — Z9119 Patient's noncompliance with other medical treatment and regimen: Secondary | ICD-10-CM

## 2019-04-05 DIAGNOSIS — R4587 Impulsiveness: Secondary | ICD-10-CM | POA: Diagnosis present

## 2019-04-05 DIAGNOSIS — R45851 Suicidal ideations: Secondary | ICD-10-CM | POA: Diagnosis present

## 2019-04-05 LAB — SARS CORONAVIRUS 2 BY RT PCR (HOSPITAL ORDER, PERFORMED IN ~~LOC~~ HOSPITAL LAB): SARS Coronavirus 2: NEGATIVE

## 2019-04-05 MED ORDER — ALBUTEROL SULFATE HFA 108 (90 BASE) MCG/ACT IN AERS
1.0000 | INHALATION_SPRAY | Freq: Four times a day (QID) | RESPIRATORY_TRACT | Status: DC | PRN
  Administered 2019-04-06: 17:00:00 1 via RESPIRATORY_TRACT
  Filled 2019-04-05: qty 6.7

## 2019-04-05 MED ORDER — HYDROXYZINE HCL 25 MG PO TABS
25.0000 mg | ORAL_TABLET | Freq: Four times a day (QID) | ORAL | Status: DC | PRN
  Administered 2019-04-05 – 2019-04-06 (×3): 25 mg via ORAL
  Filled 2019-04-05 (×3): qty 1

## 2019-04-05 MED ORDER — TRAZODONE HCL 50 MG PO TABS
50.0000 mg | ORAL_TABLET | Freq: Every evening | ORAL | Status: DC | PRN
  Administered 2019-04-05 – 2019-04-07 (×2): 50 mg via ORAL
  Filled 2019-04-05 (×2): qty 1
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
## 2019-04-05 NOTE — H&P (Signed)
Behavioral Health Medical Screening Exam  Sean Sanford is an 28 y.o. male patient presents to [facility] via police as walk in with complaints of suicidal ideation and plan to overdose or shoot himself.  Patient states that he does not have a gun at home.  Patient is vague about reason for suicidal ideation   Total Time spent with patient: 30 minutes  Psychiatric Specialty Exam: Physical Exam  Vitals reviewed. Constitutional: He is oriented to person, place, and time. He appears well-developed and well-nourished.  Neck: Normal range of motion.  Respiratory: Effort normal.  Musculoskeletal: Normal range of motion.  Neurological: He is alert and oriented to person, place, and time.  Skin: Skin is warm and dry.  Psychiatric: His mood appears anxious. His speech is delayed. He is slowed and actively hallucinating. He expresses impulsivity. He exhibits a depressed mood. He expresses suicidal ideation. He expresses suicidal plans.    Review of Systems  Psychiatric/Behavioral: Positive for depression, hallucinations and suicidal ideas. The patient is nervous/anxious.   All other systems reviewed and are negative.   Blood pressure (!) 158/96, pulse 91, temperature (!) 97.5 F (36.4 C), temperature source Oral, resp. rate 18, SpO2 98 %.There is no height or weight on file to calculate BMI.  General Appearance: Casual  Eye Contact:  Fair  Speech:  Blocked and Slow  Volume:  Normal  Mood:  Anxious and Depressed  Affect:  Depressed and Flat  Thought Process:  Disorganized  Orientation:  Full (Time, Place, and Person)  Thought Content:  Hallucinations: Auditory Command:  Voices telling him to kill himself  Suicidal Thoughts:  Yes.  with intent/plan  Homicidal Thoughts:  No  Memory:  Immediate;   Fair Recent;   Fair  Judgement:  Impaired  Insight:  Fair  Psychomotor Activity:  Normal  Concentration: Concentration: Fair  Recall:  [employer] of Knowledge:Fair  Language: Good  Akathisia:   No  Handed:  Right  AIMS (if indicated):     Assets:  Desire for Improvement Housing Social Support  Sleep:       Musculoskeletal: Strength & Muscle Tone: within normal limits Gait & Station: normal Patient leans: N/A  Blood pressure (!) 158/96, pulse 91, temperature (!) 97.5 F (36.4 C), temperature source Oral, resp. rate 18, SpO2 98 %.  Recommendations:  Inpatient psychiatric treatment  Based on my evaluation the patient does not appear to have an emergency medical condition.  Cheron Coryell, NP 04/05/2019, 6:24 PM

## 2019-04-06 ENCOUNTER — Other Ambulatory Visit: Payer: Self-pay

## 2019-04-06 ENCOUNTER — Encounter (HOSPITAL_COMMUNITY): Payer: Self-pay

## 2019-04-06 DIAGNOSIS — F333 Major depressive disorder, recurrent, severe with psychotic symptoms: Principal | ICD-10-CM

## 2019-04-06 LAB — COMPREHENSIVE METABOLIC PANEL
ALT: 31 U/L (ref 0–44)
AST: 36 U/L (ref 15–41)
Albumin: 4.8 g/dL (ref 3.5–5.0)
Alkaline Phosphatase: 39 U/L (ref 38–126)
Anion gap: 11 (ref 5–15)
BUN: 14 mg/dL (ref 6–20)
CO2: 26 mmol/L (ref 22–32)
Calcium: 9.9 mg/dL (ref 8.9–10.3)
Chloride: 104 mmol/L (ref 98–111)
Creatinine, Ser: 1.16 mg/dL (ref 0.61–1.24)
GFR calc Af Amer: >60 mL/min (ref >60)
GFR calc non Af Amer: >60 mL/min (ref >60)
Glucose, Bld: 83 mg/dL (ref 70–99)
Potassium: 4 mmol/L (ref 3.5–5.1)
Sodium: 141 mmol/L (ref 135–145)
Total Bilirubin: 1.3 mg/dL — ABNORMAL HIGH (ref 0.3–1.2)
Total Protein: 7.7 g/dL (ref 6.5–8.1)

## 2019-04-06 LAB — LIPID PANEL
Cholesterol: 167 mg/dL (ref 0–200)
HDL: 82 mg/dL (ref >40)
Total CHOL/HDL Ratio: 2 RATIO
Triglycerides: 71 mg/dL (ref <150)
VLDL: 14 mg/dL (ref 0–40)

## 2019-04-06 LAB — CBC
HCT: 46.3 % (ref 39.0–52.0)
Hemoglobin: 15.2 g/dL (ref 13.0–17.0)
MCH: 32.1 pg (ref 26.0–34.0)
MCHC: 32.8 g/dL (ref 30.0–36.0)
MCV: 97.7 fL (ref 80.0–100.0)
Platelets: 244 10*3/uL (ref 150–400)
RBC: 4.74 MIL/uL (ref 4.22–5.81)
RDW: 11.6 % (ref 11.5–15.5)
WBC: 5 10*3/uL (ref 4.0–10.5)
nRBC: 0 % (ref 0.0–0.2)

## 2019-04-06 LAB — ETHANOL: Alcohol, Ethyl (B): <10 mg/dL (ref <10)

## 2019-04-06 LAB — HEMOGLOBIN A1C
Hgb A1c MFr Bld: 4.2 % — ABNORMAL LOW (ref 4.8–5.6)
Mean Plasma Glucose: 73.84 mg/dL

## 2019-04-06 LAB — TSH: TSH: 1.248 u[IU]/mL (ref 0.350–4.500)

## 2019-04-06 MED ORDER — FLUOXETINE HCL 20 MG PO CAPS
20.0000 mg | ORAL_CAPSULE | Freq: Every day | ORAL | Status: DC
  Administered 2019-04-06 – 2019-04-07 (×2): 20 mg via ORAL
  Filled 2019-04-06 (×2): qty 1
  Filled 2019-04-06: qty 7
  Filled 2019-04-06: qty 1
  Filled 2019-04-06: qty 7
  Filled 2019-04-06: qty 1

## 2019-04-06 MED ORDER — PRENATAL MULTIVITAMIN CH
1.0000 | ORAL_TABLET | Freq: Every day | ORAL | Status: DC
  Administered 2019-04-06 – 2019-04-07 (×2): 1 via ORAL
  Filled 2019-04-06 (×5): qty 1

## 2019-04-06 MED ORDER — ZOLPIDEM TARTRATE 5 MG PO TABS
10.0000 mg | ORAL_TABLET | Freq: Every day | ORAL | Status: DC
  Administered 2019-04-06: 10 mg via ORAL
  Filled 2019-04-06: qty 2

## 2019-04-06 MED ORDER — BUSPIRONE HCL 15 MG PO TABS
15.0000 mg | ORAL_TABLET | Freq: Three times a day (TID) | ORAL | Status: DC
  Administered 2019-04-06 – 2019-04-07 (×3): 15 mg via ORAL
  Filled 2019-04-06 (×2): qty 21
  Filled 2019-04-06 (×2): qty 1
  Filled 2019-04-06: qty 21
  Filled 2019-04-06: qty 1
  Filled 2019-04-06: qty 21
  Filled 2019-04-06: qty 1
  Filled 2019-04-06: qty 21
  Filled 2019-04-06 (×3): qty 1
  Filled 2019-04-06: qty 21

## 2019-04-06 MED ORDER — NICOTINE 21 MG/24HR TD PT24
21.0000 mg | MEDICATED_PATCH | Freq: Every day | TRANSDERMAL | Status: DC
  Administered 2019-04-06 – 2019-04-07 (×2): 21 mg via TRANSDERMAL
  Filled 2019-04-06 (×5): qty 1

## 2019-04-06 MED ORDER — BACITRACIN-NEOMYCIN-POLYMYXIN OINTMENT TUBE
TOPICAL_OINTMENT | CUTANEOUS | Status: DC | PRN
  Filled 2019-04-06: qty 14.17

## 2019-04-06 NOTE — Progress Notes (Signed)
[place] NOVEL CORONAVIRUS (COVID-19) DAILY CHECK-OFF SYMPTOMS - answer yes or no to each - every day NO YES  Have you had a fever in the past 24 hours?  . Fever (Temp > 37.80C / 100F) X   Have you had any of these symptoms in the past 24 hours? . New Cough .  Sore Throat  .  Shortness of Breath .  Difficulty Breathing .  Unexplained Body Aches   X   Have you had any one of these symptoms in the past 24 hours not related to allergies?   . Runny Nose .  Nasal Congestion .  Sneezing   X   If you have had runny nose, nasal congestion, sneezing in the past 24 hours, has it worsened?  X   EXPOSURES - check yes or no X   Have you traveled outside the state in the past 14 days?  X   Have you been in contact with someone with a confirmed diagnosis of COVID-19 or PUI in the past 14 days without wearing appropriate PPE?  X   Have you been living in the same home as a person with confirmed diagnosis of COVID-19 or a PUI (household contact)?    X   Have you been diagnosed with COVID-19?    X              What to do next: Answered NO to all: Answered YES to anything:   Proceed with unit schedule Follow the [facility] Inpatient Flowsheet.   

## 2019-04-06 NOTE — Tx Team (Signed)
Initial Treatment Plan 04/06/2019 12:17 AM [facility]:[id number]    PATIENT STRESSORS: Legal issue Medication change or noncompliance Traumatic event   PATIENT STRENGTHS: Ability for insight Communication skills General fund of knowledge Physical Health Special hobby/interest Supportive family/friends Work skills   PATIENT IDENTIFIED PROBLEMS: "At risk for suicide"   "Depression"   "Anxiety and PTSD"  "My medications are not workingTherapist, art hallucinations"   "Insomnia"            DISCHARGE CRITERIA:  Ability to meet basic life and health needs Adequate post-discharge living arrangements Improved stabilization in mood, thinking, and/or behavior Verbal commitment to aftercare and medication compliance  PRELIMINARY DISCHARGE PLAN: Attend PHP/IOP Outpatient therapy Return to previous living arrangement Return to previous work or school arrangements  PATIENT/FAMILY INVOLVEMENT: This treatment plan has been presented to and reviewed with the patient, Sean Sanford.The patient have been given the opportunity to ask questions and make suggestions.  Lonia Skinner, RN 04/06/2019, 12:17 AM

## 2019-04-06 NOTE — Progress Notes (Signed)
Patient ID: Sean Sanford, male   DOB: [birth year], 28 y.o.   MRN: [id number]  [place] NOVEL CORONAVIRUS (COVID-19) DAILY CHECK-OFF SYMPTOMS - answer yes or no to each - every day NO YES  Have you had a fever in the past 24 hours?  . Fever (Temp > 37.80C / 100F) X   Have you had any of these symptoms in the past 24 hours? . New Cough .  Sore Throat  .  Shortness of Breath .  Difficulty Breathing .  Unexplained Body Aches   X   Have you had any one of these symptoms in the past 24 hours not related to allergies?   . Runny Nose .  Nasal Congestion .  Sneezing   X   If you have had runny nose, nasal congestion, sneezing in the past 24 hours, has it worsened?  X   EXPOSURES - check yes or no X   Have you traveled outside the state in the past 14 days?  X   Have you been in contact with someone with a confirmed diagnosis of COVID-19 or PUI in the past 14 days without wearing appropriate PPE?  X   Have you been living in the same home as a person with confirmed diagnosis of COVID-19 or a PUI (household contact)?    X   Have you been diagnosed with COVID-19?    X              What to do next: Answered NO to all: Answered YES to anything:   Proceed with unit schedule Follow the [facility] Inpatient Flowsheet.

## 2019-04-06 NOTE — H&P (Signed)
Psychiatric Admission Assessment Adult  Patient Identification: Sean Sanford MRN:  [id number] Date of Evaluation:  04/06/2019 Chief Complaint:  Schizoaffective Principal Diagnosis: Depression and reported suicidal thoughts to a friend who phoned police Diagnosis:  Active Problems:   MDD (major depressive disorder), recurrent, severe, with psychosis (HCC)  History of Present Illness:   This is the first admission here for this 28 year old single male patient who presented with police after they were found, patient had endorsed suicidal thoughts and plans to overdose to a friend.  Though initially vague about the reason he elaborates to me that he felt to people were after him, 1 of whom was trying to frame him for a robbery of some CDs, (told another examiner it was a television that was stolen) and the other had simply taken advantage of him-he insist this is not a paranoid set of delusions but rather reality and that is what led him to have an impulsive suicidal statement.  He states he is not suicidal now.  He denies alcohol or drug abuse.  Patient is on probation he will not state why he has an ankle bracelet he also states this is jeopardized his current housing situation and he does not want to lose his job at [employer]. Therefore is interested in getting better quickly and getting back to work.  The patient states he has been diagnosed with a schizophrenic and a bipolar condition in the past has stayed at the [facility] crisis unit in the past due to suicidal thoughts but he states that his main issue is depression, occasionally he has intrusive thoughts that are of a command nature and he believes this is been described as a hallucination in the past by other examiners.  His mother died of cancer in 03-18-15 and he described himself as very close to his mother, dreams about her, and also lost a cousin as well as a sister so these losses have all led to severe depression and he has had suicidal  thoughts intermittently since the death of his mother. He is not currently on medications. During the initial assessment of 6/16 he was thought to have some thought blocking pressured speech and perhaps might of been responding but I do not pick up any psychotic symptoms today.  He is alert and oriented and coherent and again his story is a bit all over the map but it is not particular delusional.  He denies current suicidal thoughts plans or intent and denies hallucinations.  Associated Signs/Symptoms: Depression Symptoms:  insomnia, (Hypo) Manic Symptoms:  Distractibility, Anxiety Symptoms:  Excessive Worry, Psychotic Symptoms:  Paranoia, PTSD Symptoms: Had a traumatic exposure:  States he thinks he needs treatment for PTSD but lists no specific trauma other than death of mother Total Time spent with patient: 45 minutes  Past Psychiatric History: Again patient is noncompliant with current meds he was born in [place] he states he lived in [place] for 14 years and got further he states that when he came back here he failed out of [employer] due to depression  Is the patient at risk to self? Yes.    Has the patient been a risk to self in the past 6 months? No.  Has the patient been a risk to self within the distant past? No.  Is the patient a risk to others? No.  Has the patient been a risk to others in the past 6 months? No.  Has the patient been a risk to others within the distant past?  No.   Prior Inpatient Therapy: Prior Inpatient Therapy: Yes Prior Therapy Dates: 2019 Prior Therapy Facilty/Provider(s): [facility] Reason for Treatment: depression Prior Outpatient Therapy: Prior Outpatient Therapy: Yes Prior Therapy Dates: active Prior Therapy Facilty/Provider(s): [facility] Reason for Treatment: depression/psychosis Does patient have an ACCT team?: No Does patient have Intensive In-House Services?  : No Does patient have [facility] services? : Yes Does patient have P4CC services?: No  Alcohol  Screening: 1. How often do you have a drink containing alcohol?: Never 2. How many drinks containing alcohol do you have on a typical day when you are drinking?: 1 or 2 3. How often do you have six or more drinks on one occasion?: Never AUDIT-C Score: 0 4. How often during the last year have you found that you were not able to stop drinking once you had started?: Never 5. How often during the last year have you failed to do what was normally expected from you becasue of drinking?: Never 6. How often during the last year have you needed a first drink in the morning to get yourself going after a heavy drinking session?: Never 7. How often during the last year have you had a feeling of guilt of remorse after drinking?: Never 8. How often during the last year have you been unable to remember what happened the night before because you had been drinking?: Never 9. Have you or someone else been injured as a result of your drinking?: No 10. Has a relative or friend or a doctor or another health worker been concerned about your drinking or suggested you cut down?: No Alcohol Use Disorder Identification Test Final Score (AUDIT): 0 Substance Abuse History in the last 12 months:  Yes.   Consequences of Substance Abuse: NA Previous Psychotropic Medications: Yes  Psychological Evaluations: No  Past Medical History:  Past Medical History:  Diagnosis Date  . Asthma    History reviewed. No pertinent surgical history. Family History: History reviewed. No pertinent family history. Family Psychiatric  History: neg Tobacco Screening: Have you used any form of tobacco in the last 30 days? (Cigarettes, Smokeless Tobacco, Cigars, and/or Pipes): Yes Tobacco use, Select all that apply: 4 or less cigarettes per day Are you interested in Tobacco Cessation Medications?: Yes, will notify MD for an order Counseled patient on smoking cessation including recognizing danger situations, developing coping skills and basic  information about quitting provided: Yes Social History:  Social History   Substance and Sexual Activity  Alcohol Use No     Social History   Substance and Sexual Activity  Drug Use No    Additional Social History: Marital status: Single    Pain Medications: see MAR Prescriptions: see MAR Over the Counter: see MAR History of alcohol / drug use?: No history of alcohol / drug abuse Longest period of sobriety (when/how long): N/A                    Allergies:  No Known Allergies Lab Results:  Results for orders placed or performed during the hospital encounter of 04/05/19 (from the past 48 hour(s))  SARS Coronavirus 2 (CEPHEID - Performed in [facility] hospital lab), Hosp Order     Status: None   Collection Time: 04/05/19  6:15 PM   Specimen: Nasopharyngeal Swab  Result Value Ref Range   SARS Coronavirus 2 NEGATIVE NEGATIVE    Comment: (NOTE) If result is NEGATIVE SARS-CoV-2 target nucleic acids are NOT DETECTED. The SARS-CoV-2 RNA is generally detectable in upper  and lower  respiratory specimens during the acute phase of infection. The lowest  concentration of SARS-CoV-2 viral copies this assay can detect is 250  copies / mL. A negative result does not preclude SARS-CoV-2 infection  and should not be used as the sole basis for treatment or other  patient management decisions.  A negative result may occur with  improper specimen collection / handling, submission of specimen other  than nasopharyngeal swab, presence of viral mutation(s) within the  areas targeted by this assay, and inadequate number of viral copies  (<250 copies / mL). A negative result must be combined with clinical  observations, patient history, and epidemiological information. If result is POSITIVE SARS-CoV-2 target nucleic acids are DETECTED. The SARS-CoV-2 RNA is generally detectable in upper and lower  respiratory specimens dur ing the acute phase of infection.  Positive  results are  indicative of active infection with SARS-CoV-2.  Clinical  correlation with patient history and other diagnostic information is  necessary to determine patient infection status.  Positive results do  not rule out bacterial infection or co-infection with other viruses. If result is PRESUMPTIVE POSTIVE SARS-CoV-2 nucleic acids MAY BE PRESENT.   A presumptive positive result was obtained on the submitted specimen  and confirmed on repeat testing.  While 2019 novel coronavirus  (SARS-CoV-2) nucleic acids may be present in the submitted sample  additional confirmatory testing may be necessary for epidemiological  and / or clinical management purposes  to differentiate between  SARS-CoV-2 and other Sarbecovirus currently known to infect humans.  If clinically indicated additional testing with an alternate test  methodology [phone]([id number]) is advised. The SARS-CoV-2 RNA is generally  detectable in upper and lower respiratory sp ecimens during the acute  phase of infection. The expected result is Negative. Fact Sheet for Patients:  BoilerBrush.com.cyhttps://www.fda.gov/media/136312/download Fact Sheet for Healthcare Providers: https://pope.com/https://www.fda.gov/media/136313/download This test is not yet approved or cleared by the [place] FDA and has been authorized for detection and/or diagnosis of SARS-CoV-2 by FDA under an Emergency Use Authorization (EUA).  This EUA will remain in effect (meaning this test can be used) for the duration of the COVID-19 declaration under Section 564(b)(1) of the Act, 21 U.S.C. section 360bbb-3(b)(1), unless the authorization is terminated or revoked sooner. Performed at [facility], [address] [place], [place] [address]   CBC     Status: None   Collection Time: 04/06/19  6:46 AM  Result Value Ref Range   WBC 5.0 4.0 - 10.5 K/uL   RBC 4.74 4.22 - 5.81 MIL/uL   Hemoglobin 15.2 13.0 - 17.0 g/dL   HCT 81.146.3 91.439.0 - 78.252.0 %   MCV 97.7 80.0 - 100.0 fL   MCH 32.1 26.0  - 34.0 pg   MCHC 32.8 30.0 - 36.0 g/dL   RDW 95.611.6 21.311.5 - 08.615.5 %   Platelets 244 150 - 400 K/uL   nRBC 0.0 0.0 - 0.2 %    Comment: Performed at [facility], [address] [place], [place] [address]  Comprehensive metabolic panel     Status: Abnormal   Collection Time: 04/06/19  6:46 AM  Result Value Ref Range   Sodium 141 135 - 145 mmol/L   Potassium 4.0 3.5 - 5.1 mmol/L   Chloride 104 98 - 111 mmol/L   CO2 26 22 - 32 mmol/L   Glucose, Bld 83 70 - 99 mg/dL   BUN 14 6 - 20 mg/dL   Creatinine, Ser 9.621.16 0.61 - 1.24  mg/dL   Calcium 9.9 8.9 - 16.110.3 mg/dL   Total Protein 7.7 6.5 - 8.1 g/dL   Albumin 4.8 3.5 - 5.0 g/dL   AST 36 15 - 41 U/L   ALT 31 0 - 44 U/L   Alkaline Phosphatase 39 38 - 126 U/L   Total Bilirubin 1.3 (H) 0.3 - 1.2 mg/dL   GFR calc non Af Amer >60 >60 mL/min   GFR calc Af Amer >60 >60 mL/min   Anion gap 11 5 - 15    Comment: Performed at [facility], [address] [place], [place] [address]  Hemoglobin A1c     Status: Abnormal   Collection Time: 04/06/19  6:46 AM  Result Value Ref Range   Hgb A1c MFr Bld 4.2 (L) 4.8 - 5.6 %    Comment: (NOTE) Pre diabetes:          5.7%-6.4% Diabetes:              >6.4% Glycemic control for   <7.0% adults with diabetes    Mean Plasma Glucose 73.84 mg/dL    Comment: Performed at [facility], [address] [place], [place] [address]  Ethanol     Status: None   Collection Time: 04/06/19  6:46 AM  Result Value Ref Range   Alcohol, Ethyl (B) <10 <10 mg/dL    Comment: (NOTE) Lowest detectable limit for serum alcohol is 10 mg/dL. For medical purposes only. Performed at [facility], [address] [place], [place] [address]   Lipid panel     Status: None (Preliminary result)   Collection Time: 04/06/19  6:46 AM  Result Value Ref Range   Cholesterol PENDING 0 - 200 mg/dL   Triglycerides 71 <782<150 mg/dL    Comment: Performed at [facility], [address] [place], [place] [address]   HDL PENDING >40 mg/dL   Total CHOL/HDL Ratio PENDING RATIO   VLDL 14 0 - 40 mg/dL    Comment: Performed at [facility], [address] [place], [place] [address]   LDL Cholesterol NOT CALCULATED 0 - 99 mg/dL    Comment: Performed at [facility], [address] [place], [place] [address]  TSH     Status: None   Collection Time: 04/06/19  6:46 AM  Result Value Ref Range   TSH 1.248 0.350 - 4.500 uIU/mL    Comment: Performed by a 3rd Generation assay with a functional sensitivity of <=0.01 uIU/mL. Performed at [facility], [address] [place], [place] [address]     Blood Alcohol level:  Lab Results  Component Value Date   ETH <10 04/06/2019    Metabolic Disorder Labs:  Lab Results  Component Value Date   HGBA1C 4.2 (L) 04/06/2019   MPG 73.84 04/06/2019   No results found for: PROLACTIN Lab Results  Component Value Date   CHOL PENDING 04/06/2019   TRIG 71 04/06/2019   HDL PENDING 04/06/2019   CHOLHDL PENDING 04/06/2019   VLDL 14 04/06/2019   LDLCALC NOT CALCULATED 04/06/2019    Current Medications: Current Facility-Administered Medications  Medication Dose Route Frequency Provider Last Rate Last Dose  . albuterol (VENTOLIN HFA) 108 (90 Base) MCG/ACT inhaler 1 puff  1 puff Inhalation Q6H PRN Rankin, Shuvon B, NP      . busPIRone (BUSPAR) tablet 15 mg  15 mg Oral TID Malvin JohnsFarah, Jilda Kress, MD      . FLUoxetine [facility](PROZAC)  capsule 20 mg  20 mg Oral Daily Johnn Hai, MD      . hydrOXYzine (ATARAX/VISTARIL) tablet 25 mg  25 mg Oral Q6H PRN Lindon Romp A, NP   25 mg at 04/06/19 0742  . nicotine (NICODERM CQ - dosed in mg/24 hours) patch 21 mg  21 mg Transdermal Daily Johnn Hai, MD   21 mg at 04/06/19 0741  . prenatal vitamin w/FE, FA (NATACHEW) chewable tablet 1 tablet  1 tablet Oral Q1200 Johnn Hai, MD      . traZODone (DESYREL) tablet 50 mg  50 mg Oral QHS PRN,MR X 1 Lindon Romp A, NP   50 mg at  04/05/19 2203  . zolpidem (AMBIEN) tablet 10 mg  10 mg Oral QHS Johnn Hai, MD       PTA Medications: Medications Prior to Admission  Medication Sig Dispense Refill Last Dose  . albuterol (PROVENTIL HFA;VENTOLIN HFA) 108 (90 BASE) MCG/ACT inhaler Inhale into the lungs every 6 (six) hours as needed for wheezing or shortness of breath.       Musculoskeletal: Strength & Muscle Tone: within normal limits Gait & Station: nl Patient leans: N/A  Psychiatric Specialty Exam: Physical Exam nondiagnostic  ROS neurological negative for head trauma or seizures cardiovascular negative for issues GI/GU negative  Blood pressure 119/78, pulse 81, temperature 97.8 F (36.6 C), resp. rate 18, height 5\' 11"  (1.803 m), weight 73.5 kg, SpO2 98 %.Body mass index is 22.59 kg/m.  General Appearance: Casual  Eye Contact:  Good  Speech:  Clear and Coherent  Volume:  Normal  Mood:  Dysphoric  Affect:  Appropriate and Congruent  Thought Process:  Coherent and Descriptions of Associations: Intact  Orientation:  Full (Time, Place, and Person)  Thought Content:  Logical  Suicidal Thoughts:  No  Homicidal Thoughts:  No  Memory:  3/3  Judgement:  Good  Insight:  Good  Psychomotor Activity:  Normal  Concentration:  Concentration: Good  Recall:  Good  Fund of Knowledge:  Fair  Language:  Good  Akathisia:  Negative  Handed:  Right  AIMS (if indicated):     Assets:  Resilience Social Support  ADL's:  Intact  Cognition:  WNL  Sleep:  Number of Hours: 6.25        Treatment Plan Summary: Daily contact with patient to assess and evaluate symptoms and progress in treatment and Medication management  Observation Level/Precautions:  15 minute checks  Laboratory:  UDS  Psychotherapy: Cognitive  Medications: BuSpar/fluoxetine/sleep aid  Consultations: Not necessary  Discharge Concerns: Diagnostic clarity-legal concerns  Estimated LOS: 2 days  Other: Axis I depression recurrent severe without  psychosis both with possible intrusive thoughts history of substance abuse   Physician Treatment Plan for Primary Diagnosis: <principal problem not specified> Long Term Goal(s): Improvement in symptoms so as ready for discharge  Short Term Goals: Ability to demonstrate self-control will improve, Ability to maintain clinical measurements within normal limits will improve and Ability to identify triggers associated with substance abuse/mental health issues will improve  Physician Treatment Plan for Secondary Diagnosis: Active Problems:   MDD (major depressive disorder), recurrent, severe, with psychosis ([place])  Long Term Goal(s): Improvement in symptoms so as ready for discharge  Short Term Goals: Ability to demonstrate self-control will improve and Ability to maintain clinical measurements within normal limits will improve  I certify that inpatient services furnished can reasonably be expected to improve the patient's condition.    Johnn Hai, MD 6/17/202010:55 AM

## 2019-04-06 NOTE — BHH Group Notes (Signed)
Occupational Therapy Group Note  Date:  04/06/2019 Time:  5:20 PM  Group Topic/Focus:  Leisure Group  Participation Level:  Active  Participation Quality:  Attentive  Affect:  Blunted  Cognitive:  Alert  Insight: Lacking  Engagement in Group:  Engaged  Modes of Intervention:  Activity, Discussion, Education and Socialization  Additional Comments:    S: "I have a goal of opening my own business"  O: OT group focus on leisure this date, while incorporating coping skills to ensure understanding. Pts to play game of Uno: and name a struggle they're facing, a communication skill, something they like about yourself, stress management, and a healthy way to manage anger per certain cards played. Pt also assessed for attention, ability to follow rules, and temperament with rules.   A: Pt presents with blunted affect, engaged and participatory throughout entirety of session. He shares a goal of opening his own business one day. He displays decreased attention, needing cues to follow along and writing in journal when playing game. He shares that he is trying to write a book. Pt left group half way through with MSW And did not return.  P: OT group will be x1 per week while pt inpatient.   Zenovia Jarred, MSOT, OTR/L Behavioral Health OT/ Acute Relief OT PHP Office: [place] 04/06/2019, 5:20 PM

## 2019-04-06 NOTE — Progress Notes (Signed)
D: Pt denies SI/HI/AVH. Pt is pleasant and cooperative. Pt stated he was doing ok. Pt visible in dayroom with peers.   A: Pt was offered support and encouragement. Pt was given scheduled medications. Pt was encourage to attend groups. Q 15 minute checks were done for safety.   R:Pt attends groups and interacts well with peers and staff. Pt is taking medication. Pt has no complaints.Pt receptive to treatment and safety maintained on unit.   Problem: Activity: Goal: Interest or engagement in leisure activities will improve Outcome: Progressing

## 2019-04-06 NOTE — Progress Notes (Addendum)
Admission Note:   Sean Sanford is an 28 y.o. male who presents VOL to [facility] for suicidal ideation with no plan and psychosis. Patient states that he has been suicidal since his mother died in Feb 22, 2015. Pt states his sister and cousin also died thereafter. Pt states "It has been a downhill ever since". Pt states he goes to [facility] for medication management.Pt states he is off his medications for the past nine months and states that he is hearing voices to kill himself. Pt states he is stressed out because his girlfriend is pregnant. Pt states he lives with his roommates. 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 Pt states he feels useless since he drop out of college. While at [facility], [place] states he would like to try new medications for his "anxiety and PTSD". Pt wears a ankle monitor on left side but denies legal issues. Pt denies HI. Pt denies drug/alcohol use. Skin was assessed and found to be clear of any abnormal marks. Pt searched and no contraband found, POC and unit policies explained and understanding verbalized. Consents obtained. Food and fluids offered, and both accepted. Pt had no additional questions or concerns. Mask was given. Belongings in locker #38.
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
## 2019-04-06 NOTE — Plan of Care (Signed)
D: Pt alert and oriented on the unit. Pt engaging with RN staff and other pts. Pt denies SI/HI, A/VH. Pt participated during unit groups and is pleasant and cooperative. A: Education, support and encouragement provided, q15 minute safety checks remain in effect. Medications administered per MD orders. R: No reactions/side effects to medicine noted. Pt denies any concerns at this time, and verbally contracts for safety. Pt ambulating on the unit with no issues. Pt remains safe on and off the unit.   Problem: Activity: Goal: Interest or engagement in leisure activities will improve Outcome: Progressing   Problem: Coping: Goal: Coping ability will improve Outcome: Progressing

## 2019-04-06 NOTE — Tx Team (Signed)
Interdisciplinary Treatment and Diagnostic Plan Update  04/06/2019 Time of Session: 09:04am Sean Sanford MRN: [id number]  Principal Diagnosis: <principal problem not specified>  Secondary Diagnoses: Active Problems:   MDD (major depressive disorder), recurrent, severe, with psychosis ([place])   Current Medications:  Current Facility-Administered Medications  Medication Dose Route Frequency Provider Last Rate Last Dose  . albuterol (VENTOLIN HFA) 108 (90 Base) MCG/ACT inhaler 1 puff  1 puff Inhalation Q6H PRN Rankin, Shuvon B, NP      . hydrOXYzine (ATARAX/VISTARIL) tablet 25 mg  25 mg Oral Q6H PRN Lindon Romp A, NP   25 mg at 04/06/19 0742  . nicotine (NICODERM CQ - dosed in mg/24 hours) patch 21 mg  21 mg Transdermal Daily Johnn Hai, MD   21 mg at 04/06/19 0741  . traZODone (DESYREL) tablet 50 mg  50 mg Oral QHS PRN,MR X 1 Lindon Romp A, NP   50 mg at 04/05/19 2203   PTA Medications: Medications Prior to Admission  Medication Sig Dispense Refill Last Dose  . albuterol (PROVENTIL HFA;VENTOLIN HFA) 108 (90 BASE) MCG/ACT inhaler Inhale into the lungs every 6 (six) hours as needed for wheezing or shortness of breath.       Patient Stressors: Legal issue Medication change or noncompliance Traumatic event  Patient Strengths: Ability for insight Communication skills General fund of knowledge Physical Health Special hobby/interest Supportive family/friends Work skills  Treatment Modalities: Medication Management, Group therapy, Case management,  1 to 1 session with clinician, Psychoeducation, Recreational therapy.   Physician Treatment Plan for Primary Diagnosis: <principal problem not specified> Long Term Goal(s):     Short Term Goals:    Medication Management: Evaluate patient's response, side effects, and tolerance of medication regimen.  Therapeutic Interventions: 1 to 1 sessions, Unit Group sessions and Medication administration.  Evaluation of Outcomes: Not  Met  Physician Treatment Plan for Secondary Diagnosis: Active Problems:   MDD (major depressive disorder), recurrent, severe, with psychosis ([place])  Long Term Goal(s):     Short Term Goals:       Medication Management: Evaluate patient's response, side effects, and tolerance of medication regimen.  Therapeutic Interventions: 1 to 1 sessions, Unit Group sessions and Medication administration.  Evaluation of Outcomes: Not Met   RN Treatment Plan for Primary Diagnosis: <principal problem not specified> Long Term Goal(s): Knowledge of disease and therapeutic regimen to maintain health will improve  Short Term Goals: Ability to participate in decision making will improve, Ability to verbalize feelings will improve, Ability to disclose and discuss suicidal ideas, Ability to identify and develop effective coping behaviors will improve and Compliance with prescribed medications will improve  Medication Management: RN will administer medications as ordered by provider, will assess and evaluate patient's response and provide education to patient for prescribed medication. RN will report any adverse and/or side effects to prescribing provider.  Therapeutic Interventions: 1 on 1 counseling sessions, Psychoeducation, Medication administration, Evaluate responses to treatment, Monitor vital signs and CBGs as ordered, Perform/monitor CIWA, COWS, AIMS and Fall Risk screenings as ordered, Perform wound care treatments as ordered.  Evaluation of Outcomes: Not Met   LCSW Treatment Plan for Primary Diagnosis: <principal problem not specified> Long Term Goal(s): Safe transition to appropriate next level of care at discharge, Engage patient in therapeutic group addressing interpersonal concerns.  Short Term Goals: Engage patient in aftercare planning with referrals and resources and Increase skills for wellness and recovery  Therapeutic Interventions: Assess for all discharge needs, 1 to 1 time with Social  worker,  Explore available resources and support systems, Assess for adequacy in community support network, Educate family and significant other(s) on suicide prevention, Complete Psychosocial Assessment, Interpersonal group therapy.  Evaluation of Outcomes: Not Met   Progress in Treatment: Attending groups: No. Participating in groups: No. Taking medication as prescribed: Yes. Toleration medication: Yes. Family/Significant other contact made: No, will contact:  will contact if given consent to contact Patient understands diagnosis: Yes. Discussing patient identified problems/goals with staff: Yes. Medical problems stabilized or resolved: Yes. Denies suicidal/homicidal ideation: Yes. Issues/concerns per patient self-inventory: No. Other:   New problem(s) identified: No, Describe:  None  New Short Term/Long Term Goal(s): Medication stabilization, elimination of SI thoughts, and development of a comprehensive mental wellness plan.   Patient Goals:  "Get my thoughts together and remain logical. I want to get on more medications"  Discharge Plan or Barriers: CSW will continue to follow up for appropriate referrals and possible discharge planning  Reason for Continuation of Hospitalization: Aggression Anxiety Medication stabilization  Estimated Length of Stay: 3-5 days  Attendees: Patient: 04/06/2019   Physician: Dr. Johnn Hai, MD 04/06/2019   Nursing: Elberta Fortis, RN 04/06/2019   RN Care Manager: 04/06/2019   Social Worker: Ardelle Anton, LCSW 04/06/2019   Recreational Therapist:  04/06/2019   Other:  04/06/2019   Other:  04/06/2019   Other: 04/06/2019       Scribe for Treatment Team: Trecia Rogers, LCSW 04/06/2019 10:29 AM

## 2019-04-06 NOTE — BHH Suicide Risk Assessment (Signed)
[facility] Treatment Admission Suicide Risk Assessment   Nursing information obtained from:    Demographic factors:  Male Current Mental Status:  Suicidal ideation indicated by patient Loss Factors:  Legal issues, Financial problems / change in socioeconomic status Historical Factors:  NA Risk Reduction Factors:  Employed, Living with another person, especially a relative, Positive social support  Total Time spent with patient: 45 minutes Principal Problem: Depression Diagnosis:  Active Problems:   MDD (major depressive disorder), recurrent, severe, with psychosis ([place])  Subjective Data: 28 year old male admitted after reporting suicidal thoughts reporting multiple stressors but no current suicidal thoughts on this eval see full evaluation  Continued Clinical Symptoms:  Alcohol Use Disorder Identification Test Final Score (AUDIT): 0 The "Alcohol Use Disorders Identification Test", Guidelines for Use in Primary Care, Second Edition.  World Pharmacologist [facility]). Score between 0-7:  no or low risk or alcohol related problems. Score between 8-15:  moderate risk of alcohol related problems. Score between 16-19:  high risk of alcohol related problems. Score 20 or above:  warrants further diagnostic evaluation for alcohol dependence and treatment.   CLINICAL FACTORS:   Depression:   Anhedonia   Musculoskeletal: Strength & Muscle Tone: within normal limits Gait & Station: nl Patient leans: N/A  Psychiatric Specialty Exam: Physical Exam nondiagnostic  ROS neurological negative for head trauma or seizures cardiovascular negative for issues GI/GU negative  Blood pressure 119/78, pulse 81, temperature 97.8 F (36.6 C), resp. rate 18, height 5\' 11"  (1.803 m), weight 73.5 kg, SpO2 98 %.Body mass index is 22.59 kg/m.  General Appearance: Casual  Eye Contact:  Good  Speech:  Clear and Coherent  Volume:  Normal  Mood:  Dysphoric  Affect:  Appropriate and Congruent  Thought Process:  Coherent and  Descriptions of Associations: Intact  Orientation:  Full (Time, Place, and Person)  Thought Content:  Logical  Suicidal Thoughts:  No  Homicidal Thoughts:  No  Memory:  3/3  Judgement:  Good  Insight:  Good  Psychomotor Activity:  Normal  Concentration:  Concentration: Good  Recall:  Good  Fund of Knowledge:  Fair  Language:  Good  Akathisia:  Negative  Handed:  Right  AIMS (if indicated):     Assets:  Resilience Social Support  ADL's:  Intact  Cognition:  WNL  Sleep:  Number of Hours: 6.25      COGNITIVE FEATURES THAT CONTRIBUTE TO RISK:  None    SUICIDE RISK:   Minimal: No identifiable suicidal ideation.  Patients presenting with no risk factors but with morbid ruminations; may be classified as minimal risk based on the severity of the depressive symptoms  PLAN OF CARE: see eval  I certify that inpatient services furnished can reasonably be expected to improve the patient's condition.   Johnn Hai, MD 04/06/2019, 10:53 AM

## 2019-04-06 NOTE — Progress Notes (Signed)
Patient ID: Sean Sanford, male   DOB: [birth year], 28 y.o.   MRN: [id number]   Pt asked to speak with CSW. Pt asked for a letter for his employer. Pt stated that he was nervous and did not know what to put into the letter. CSW explained what is normally in the letters to employers and what will be in the letter. CSW informed the pt to let the CSW who does his discharge when he is ready to discharge know what letters that she needs and when he wants to go back to work. Pt also discussed needing a letter for the courts/probations since he is on an ankle monitor. CSW explained that the letter is generic but he will be able to take his discharge paperwork and use those with the court system/probation. Pt stated that he should be okay with not charging his ankle monitor if he gets out tomorrow.
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
## 2019-04-06 NOTE — Progress Notes (Signed)
Recreation Therapy Notes  Date: 6.17.20 Time: 1000 Location: 500 Hall Dayroom  Group Topic: Triggers  Goal Area(s) Addresses:  Patient will identify their biggest triggers. Patient will identify how to avoid triggers. Patient will identify how to deal with triggers head on.   Behavioral Response:  None  Intervention: Worksheet  Activity: Triggers.  Patients were to identify their three biggest triggers.  Patients were to identify three ways in which to deal with trigger head on and three was to avoid or reduce exposure to triggers.  Education: Triggers, Discharge Planning  Education Outcome: Acknowledges understanding/In group clarification offered/Needs additional education.   Clinical Observations/Feedback: Patient came to group but was called out of group to meet with doctor.    Victorino Sparrow, LRT/CTRS      Victorino Sparrow A 04/06/2019 11:53 AM

## 2019-04-07 MED ORDER — ZOLPIDEM TARTRATE 10 MG PO TABS: 10.0000 mg | ORAL_TABLET | Freq: Every day | ORAL | 1 refills | Status: DC

## 2019-04-07 MED ORDER — BUSPIRONE HCL 15 MG PO TABS: 15.0000 mg | ORAL_TABLET | Freq: Three times a day (TID) | ORAL | 2 refills | Status: DC

## 2019-04-07 MED ORDER — FLUOXETINE HCL 20 MG PO CAPS: 20.0000 mg | ORAL_CAPSULE | Freq: Every day | ORAL | 1 refills | Status: DC

## 2019-04-07 MED ORDER — ALBUTEROL SULFATE HFA 108 (90 BASE) MCG/ACT IN AERS: 2.0000 | INHALATION_SPRAY | Freq: Four times a day (QID) | RESPIRATORY_TRACT | 2 refills | Status: AC | PRN

## 2019-04-07 NOTE — Progress Notes (Signed)
Recreation Therapy Notes  Date: 6.18.20 Time: 1000 Location: 500 Hall Dayroom   Group Topic: Leisure Education  Goal Area(s) Addresses:  Patient will identify positive leisure activities.  Patient will identify one positive benefit of participation in leisure activities.   Behavioral Response: Engaged  Intervention: Leisure Group Game  Activity: Pictionary.  Patients and staff participated in a game of [place].  One person picks a word from the container and draws what it represents on the board.  The remainder of the group tries to guess what it is.  The person that guesses correctly, gets the next turn.  Each person gets one minute to complete their turn.  Education:  Leisure Education, Dentist  Education Outcome: Acknowledges education/In group clarification offered/Needs additional education  Clinical Observations/Feedback: Pt was bright and engaged throughout.  Pt was social with peers and pleasant in groups.   Victorino Sparrow, LRT/CTRS         Ria Comment, Blakelyn Dinges A 04/07/2019 10:53 AM

## 2019-04-07 NOTE — [facility] Suicide Risk Assessment (Signed)
[facility] INPATIENT:  Family/Significant Other Suicide Prevention Education  Suicide Prevention Education:  Education Completed; Jadarius Commons, brother, [phone],  has been identified by the patient as the family member/significant other with whom the patient will be residing, and identified as the person(s) who will aid the patient in the event of a mental health crisis (suicidal ideations/suicide attempt).  With written consent from the patient, the family member/significant other has been provided the following suicide prevention education, prior to the and/or following the discharge of the patient.  The suicide prevention education provided includes the following:  Suicide risk factors  Suicide prevention and interventions  National Suicide Hotline telephone number  [facility] assessment telephone number  [facility] Emergency Assistance [place] and/or Residential Mobile Crisis Unit telephone number  Request made of family/significant other to:  Remove weapons (e.g., guns, rifles, knives), all items previously/currently identified as safety concern.  Brother is not aware of any guns that pt has access to.   Remove drugs/medications (over-the-counter, prescriptions, illicit drugs), all items previously/currently identified as a safety concern.  The family member/significant other verbalizes understanding of the suicide prevention education information provided.  The family member/significant other agrees to remove the items of safety concern listed above.  Brother has spoken with pt several times since he has been at [facility] including last night and feels like pt sounds better and is ready to be discharged.  Brother lives in [place] but does maintain regular contact.  Pt also has other family in [place] for support.    Joanne Chars, LCSW 04/07/2019, 9:07 AM

## 2019-04-07 NOTE — Progress Notes (Signed)
Patient ID: Sean Sanford, male   DOB: September 30, 1991, 28 y.o.   MRN: [id number] Patient discharged to home/self care in the presence of family.  Patient denies SI, HI and AVH.  Patient acknowledged understanding of all discharge instructions and receipt of all personal belongings.

## 2019-04-07 NOTE — Progress Notes (Signed)
Recreation Therapy Notes  INPATIENT RECREATION TR PLAN  Patient Details Name: Schuyler Olden MRN: [id number] DOB: 1991/09/03 Today's Date: 04/07/2019  Rec Therapy Plan Is patient appropriate for Therapeutic Recreation?: Yes Treatment times per week: about 3 days Estimated Length of Stay: 5-7 days TR Treatment/Interventions: Group participation (Comment)  Discharge Criteria Pt will be discharged from therapy if:: Discharged Treatment plan/goals/alternatives discussed and agreed upon by:: Patient/family  Discharge Summary Short term goals set: See patient care plan Short term goals met: Adequate for discharge Progress toward goals comments: Groups attended Which groups?: Other (Comment), Leisure education([employer]) Reason goals not met: Pt is discharging. Therapeutic equipment acquired: N/A Reason patient discharged from therapy: Discharge from hospital Pt/family agrees with progress & goals achieved: Yes Date patient discharged from therapy: 04/07/19   Victorino Sparrow, LRT/CTRS  Ria Comment, Allean Montfort A 04/07/2019, 11:17 AM

## 2019-04-07 NOTE — Progress Notes (Signed)
  [facility] : [facility] Adult Case Management Discharge Plan :  Will you be returning to the same living situation after discharge:  Yes,  own home At discharge, do you have transportation home?: Yes,  friend Do you have the ability to pay for your medications: No.  Release of information consent forms completed and in the chart;  Patient's signature needed at discharge.  Patient to Follow up at: Follow-up Information    [facility] Follow up on 04/19/2019.   Why: Telephonic hospital follow up appointment is Tuesday, 6/30 at 9:00a.  The provider will contact you the day of the appointment.  Contact information: [address] [place] [address] [phone]           Next level of care provider has access to [place] and Suicide Prevention discussed: Yes,  with brother  Have you used any form of tobacco in the last 30 days? (Cigarettes, Smokeless Tobacco, Cigars, and/or Pipes): Yes  Has patient been referred to the Quitline?: Yes, faxed on 04/07/19  Patient has been referred for addiction treatment: Ruthann Cancer, LCSW 04/07/2019, 10:14 AM

## 2019-04-07 NOTE — Discharge Summary (Signed)
Physician Discharge Summary Note  Patient:  Sean Sanford is an 28 y.o., male MRN:  [id number] DOB:  April 17, 1991 Patient phone:  [phone] (home)  Patient address:   [address] [place] [address],  Total Time spent with patient: 45 minutes  Date of Admission:  04/05/2019 Date of Discharge: 04/09/2019  Reason for Admission:   This is the first admission here for this 28 year old single male patient who presented with police after they were found, patient had endorsed suicidal thoughts and plans to overdose to a friend.  Though initially vague about the reason he elaborates to me that he felt to people were after him, 1 of whom was trying to frame him for a robbery of some CDs, (told another examiner it was a television that was stolen) and the other had simply taken advantage of him-he insist this is not a paranoid set of delusions but rather reality and that is what led him to have an impulsive suicidal statement.  He states he is not suicidal now.  He denies alcohol or drug abuse.  Patient is on probation he will not state why he has an ankle bracelet he also states this is jeopardized his current housing situation and he does not want to lose his job at [employer]. Therefore is interested in getting better quickly and getting back to work.  The patient states he has been diagnosed with a schizophrenic and a bipolar condition in the past has stayed at the [facility] crisis unit in the past due to suicidal thoughts but he states that his main issue is depression, occasionally he has intrusive thoughts that are of a command nature and he believes this is been described as a hallucination in the past by other examiners.  His mother died of cancer in 2014-12-26 and he described himself as very close to his mother, dreams about her, and also lost a cousin as well as a sister so these losses have all led to severe depression and he has had suicidal thoughts intermittently since the death  of his mother. He is not currently on medications. During the initial assessment of 6/16 he was thought to have some thought blocking pressured speech and perhaps might of been responding but I do not pick up any psychotic symptoms today.  He is alert and oriented and coherent and again his story is a bit all over the map but it is not particular delusional.  He denies current suicidal thoughts plans or intent and denies hallucinations.   Principal Problem: depression/suicidal thoughts Discharge Diagnoses: Active Problems:   MDD (major depressive disorder), recurrent, severe, with psychosis ([place])   Past Psychiatric History: I did not find he met the criteria for schizoaffective condition as other examiners had in the past  Past Medical History:  Past Medical History:  Diagnosis Date  . Asthma    History reviewed. No pertinent surgical history. Family History: History reviewed. No pertinent family history. Family Psychiatric  History: see eval Social History:  Social History   Substance and Sexual Activity  Alcohol Use No     Social History   Substance and Sexual Activity  Drug Use No    Social History   Socioeconomic History  . Marital status: Single    Spouse name: Not on file  . Number of children: Not on file  . Years of education: Not on file  . Highest education level: Not on file  Occupational History  . Not on file  Social  Needs  . Financial resource strain: Not on file  . Food insecurity    Worry: Not on file    Inability: Not on file  . Transportation needs    Medical: Not on file    Non-medical: Not on file  Tobacco Use  . Smoking status: Never Smoker  . Smokeless tobacco: Never Used  Substance and Sexual Activity  . Alcohol use: No  . Drug use: No  . Sexual activity: Not on file  Lifestyle  . Physical activity    Days per week: Not on file    Minutes per session: Not on file  . Stress: Not on file  Relationships  . Social Herbalist  on phone: Not on file    Gets together: Not on file    Attends religious service: Not on file    Active member of club or organization: Not on file    Attends meetings of clubs or organizations: Not on file    Relationship status: Not on file  Other Topics Concern  . Not on file  Social History Narrative  . Not on file    Hospital Course:   Once here the patient remained clear coherent displayed no danger behaviors and again did not meet the criteria for psychotic disorder he requested meds for depression he was augmented with BuSpar and further given a sleep aid by the morning of the 18th remain clear and coherent denied thoughts of harming self or others and could contract fully so he was stable to be released home.  Meds are listed below  Physical Findings: AIMS: Facial and Oral Movements Muscles of Facial Expression: None, normal Lips and Perioral Area: None, normal Jaw: None, normal Tongue: None, normal,Extremity Movements Upper (arms, wrists, hands, fingers): None, normal Lower (legs, knees, ankles, toes): None, normal, Trunk Movements Neck, shoulders, hips: None, normal, Overall Severity Severity of abnormal movements (highest score from questions above): None, normal Incapacitation due to abnormal movements: None, normal Patient's awareness of abnormal movements (rate only patient's report): No Awareness, Dental Status Current problems with teeth and/or dentures?: No Does patient usually wear dentures?: No  CIWA:    COWS:    Musculoskeletal: Strength & Muscle Tone: within normal limits Gait & Station: normal Patient leans: N/A  Psychiatric Specialty Exam: ROS  Blood pressure 122/88, pulse 67, temperature 97.8 F (36.6 C), resp. rate 18, height '5\' 11"'  (1.803 m), weight 73.5 kg, SpO2 100 %.Body mass index is 22.59 kg/m.  General Appearance: Casual  Eye Contact::  Good  Speech:  Clear and Coherent409  Volume:  Normal  Mood:  Euthymic  Affect:  Congruent  Thought  Process:  Coherent and Descriptions of Associations: Intact  Orientation:  full  Thought Content:  Logical and Tangential  Suicidal Thoughts:  No  Homicidal Thoughts:  No  Memory:  Immediate;   Fair  Judgement:  Fair  Insight:  Good  Psychomotor Activity:  Normal  Concentration:  Good  Recall:  Good  Fund of Knowledge:Good  Language: Good  Akathisia:  Negative  Handed:  Right  AIMS (if indicated):     Assets:  Communication Skills Desire for Improvement Physical Health Resilience Social Support  Sleep:  Number of Hours: 5  Cognition: WNL  ADL's:  Intact    Have you used any form of tobacco in the last 30 days? (Cigarettes, Smokeless Tobacco, Cigars, and/or Pipes): Yes  Has this patient used any form of tobacco in the last 30 days? (Cigarettes, Smokeless  Tobacco, Cigars, and/or Pipes) Yes, No  Blood Alcohol level:  Lab Results  Component Value Date   ETH <10 25/18/9842    Metabolic Disorder Labs:  Lab Results  Component Value Date   HGBA1C 4.2 (L) 04/06/2019   MPG 73.84 04/06/2019   No results found for: PROLACTIN Lab Results  Component Value Date   CHOL 167 04/06/2019   TRIG 71 04/06/2019   HDL 82 04/06/2019   CHOLHDL 2.0 04/06/2019   VLDL 14 04/06/2019   LDLCALC NOT CALCULATED 04/06/2019    See Psychiatric Specialty Exam and Suicide Risk Assessment completed by Attending Physician prior to discharge.  Discharge destination:  Home  Is patient on multiple antipsychotic therapies at discharge:  No   Has Patient had three or more failed trials of antipsychotic monotherapy by history:  No  Recommended Plan for Multiple Antipsychotic Therapies: NA   Allergies as of 04/07/2019   No Known Allergies     Medication List    TAKE these medications     Indication  albuterol 108 (90 Base) MCG/ACT inhaler Commonly known as: VENTOLIN HFA Inhale 2 puffs into the lungs every 6 (six) hours as needed for wheezing or shortness of breath. What changed: how much to  take  Indication: Asthma, Spasm of Lung Air Passages   busPIRone 15 MG tablet Commonly known as: BUSPAR Take 1 tablet (15 mg total) by mouth 3 (three) times daily.  Indication: Major Depressive Disorder   FLUoxetine 20 MG capsule Commonly known as: PROZAC Take 1 capsule (20 mg total) by mouth daily.  Indication: Depression   zolpidem 10 MG tablet Commonly known as: AMBIEN Take 1 tablet (10 mg total) by mouth at bedtime.  Indication: Trouble Sleeping      Follow-up Information    [facility] Follow up on 04/19/2019.   Why: Telephonic hospital follow up appointment is Tuesday, 6/30 at 9:00a.  The provider will contact you the day of the appointment.  Contact information: [address] [place] [address] [phone]           SignedJohnn Hai, MD 04/07/2019, 8:28 AM

## 2019-04-07 NOTE — [facility] Suicide Risk Assessment (Signed)
[facility] Discharge Suicide Risk Assessment   Principal Problem: MDD Discharge Diagnoses: Active Problems:   MDD (major depressive disorder), recurrent, severe, with psychosis ([facility])   Total Time spent with patient: 45 minutes  Musculoskeletal: Strength & Muscle Tone: within normal limits Gait & Station: normal Patient leans: N/A  Psychiatric Specialty Exam: ROS  Blood pressure 122/88, pulse 67, temperature 97.8 F (36.6 C), resp. rate 18, height 5\' 11"  (1.803 m), weight 73.5 kg, SpO2 100 %.Body mass index is 22.59 kg/m.  General Appearance: Casual  Eye Contact::  Good  Speech:  Clear and Coherent409  Volume:  Normal  Mood:  Euthymic  Affect:  Congruent  Thought Process:  Coherent and Descriptions of Associations: Intact  Orientation:  full  Thought Content:  Logical and Tangential  Suicidal Thoughts:  No  Homicidal Thoughts:  No  Memory:  Immediate;   Fair  Judgement:  Fair  Insight:  Good  Psychomotor Activity:  Normal  Concentration:  Good  Recall:  Good  Fund of Knowledge:Good  Language: Good  Akathisia:  Negative  Handed:  Right  AIMS (if indicated):     Assets:  Communication Skills Desire for Improvement Physical Health Resilience Social Support  Sleep:  Number of Hours: 5  Cognition: WNL  ADL's:  Intact   Mental Status Per Nursing Assessment::   On Admission:  Suicidal ideation indicated by patient  Demographic Factors:  Male  Loss Factors: Legal issues  Historical Factors: NA  Risk Reduction Factors:   Sense of responsibility to family, Religious beliefs about death and Employed  Continued Clinical Symptoms:  Dysthymia  Cognitive Features That Contribute To Risk:  None    Suicide Risk:  Minimal: No identifiable suicidal ideation.  Patients presenting with no risk factors but with morbid ruminations; may be classified as minimal risk based on the severity of the depressive symptoms  Follow-up Information    [facility] Follow up on 04/19/2019.    Why: Telephonic hospital follow up appointment is Tuesday, 6/30 at 9:00a.  The provider will contact you the day of the appointment.  Contact information: [address] [place] [address] [phone]           Plan Of Care/Follow-up recommendations:  Activity:  full  Rosena Bartle, MD 04/07/2019, 8:26 AM

## 2019-04-07 NOTE — Plan of Care (Signed)
Pt attended fully participated in one group session and is being discharged.    Victorino Sparrow, LRT/CTRS

## 2019-05-07 ENCOUNTER — Encounter (HOSPITAL_COMMUNITY): Payer: Self-pay | Admitting: Emergency Medicine

## 2019-05-07 ENCOUNTER — Emergency Department (HOSPITAL_COMMUNITY)
Admission: EM | Admit: 2019-05-07 | Discharge: 2019-05-07 | Disposition: A | Discharge status: Home / Self Care | Payer: Medicaid - Out of State | Attending: Emergency Medicine | Admitting: Emergency Medicine

## 2019-05-07 ENCOUNTER — Other Ambulatory Visit: Payer: Self-pay

## 2019-05-07 DIAGNOSIS — F329 Major depressive disorder, single episode, unspecified: Secondary | ICD-10-CM | POA: Insufficient documentation

## 2019-05-07 DIAGNOSIS — M546 Pain in thoracic spine: Secondary | ICD-10-CM

## 2019-05-07 DIAGNOSIS — Z79899 Other long term (current) drug therapy: Secondary | ICD-10-CM | POA: Insufficient documentation

## 2019-05-07 DIAGNOSIS — J45909 Unspecified asthma, uncomplicated: Secondary | ICD-10-CM | POA: Insufficient documentation

## 2019-05-07 MED ORDER — NAPROXEN 500 MG PO TABS: 500.0000 mg | ORAL_TABLET | Freq: Two times a day (BID) | ORAL | 0 refills | Status: DC

## 2019-05-07 MED ORDER — NAPROXEN 500 MG PO TABS
500.0000 mg | ORAL_TABLET | Freq: Once | ORAL | Status: AC
  Administered 2019-05-07: 21:00:00 500 mg via ORAL
  Filled 2019-05-07: qty 1

## 2019-05-07 NOTE — ED Provider Notes (Signed)
[facility]-EMERGENCY DEPT Provider Note   CSN: [id number] Arrival date & time: 05/07/19  2009     History   Chief Complaint Chief Complaint  Patient presents with  . Back Pain    HPI Sean Sanford is a [age] y.o. male with a hx of asthma & depression who presents to the ED via EMS with complaints of back pain for several days. Patient states pain is to the R thoracic back, it feels like a spasm, no specific alleviating/aggravating factors. Denies traumatic injury. Reports he has had increased stress/anxiety & has been tense. Denies numbness, tingling, weakness, saddle anesthesia, incontinence to bowel/bladder, fever, chills, IV drug use, dysuria, or hx of cancer. Patient has not had prior back surgeries. Denies chest pain/dyspnea. Denies SI, HI, or hallucinations.   Per EMS to triage team- he called them due to being out late & not being able to make it home prior to curfew, he is on house arrest, he had no specific complaints to EMS team.       HPI  Past Medical History:  Diagnosis Date  . Asthma     Patient Active Problem List   Diagnosis Date Noted  . 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 No pertinent surgical history.      Home Medications    Prior to Admission medications   Medication Sig Start Date End Date Taking? Authorizing Provider  albuterol (VENTOLIN HFA) 108 (90 Base) MCG/ACT inhaler Inhale 2 puffs into the lungs every 6 (six) hours as needed for wheezing or shortness of breath. 04/07/19   Malvin JohnsFarah, Brian, MD  busPIRone (BUSPAR) 15 MG tablet Take 1 tablet (15 mg total) by mouth 3 (three) times daily. 04/07/19   Malvin JohnsFarah, Brian, MD  FLUoxetine (PROZAC) 20 MG capsule Take 1 capsule (20 mg total) by mouth daily. 04/07/19   Malvin JohnsFarah, Brian, MD  naproxen (NAPROSYN) 500 MG tablet Take 1 tablet (500 mg total) by mouth 2 (two) times daily. 05/07/19   Mikaelyn Arthurs R, PA-C  zolpidem  (AMBIEN) 10 MG tablet Take 1 tablet (10 mg total) by mouth at bedtime. 04/07/19   Malvin JohnsFarah, Brian, MD    Family History No family history on file.  Social History Social History   Tobacco Use  . Smoking status: Never Smoker  . Smokeless tobacco: Never Used  Substance Use Topics  . Alcohol use: No  . Drug use: No     Allergies   Patient has no known allergies.   Review of Systems Review of Systems  Constitutional: Negative for chills, fever and unexpected weight change.  Respiratory: Negative for shortness of breath.   Cardiovascular: Negative for chest pain.  Gastrointestinal: Negative for abdominal pain, nausea and vomiting.  Genitourinary: Negative for dysuria.  Musculoskeletal: Positive for back pain.  Neurological: Negative for weakness and numbness.       Negative for saddle anesthesia or bowel/bladder incontinence.   Psychiatric/Behavioral: Negative for hallucinations, self-injury and suicidal ideas. The patient is nervous/anxious.        Negative for HI     Physical Exam Updated Vital Signs BP 127/87 (BP Location: Left Arm)   Pulse 91   Temp 98.6 F (37 C) (Oral)   Resp 18   Ht 5\' 11"  (1.803 m)   Wt 72.6 kg   SpO2 100%   BMI 22.32 kg/m   Physical Exam Constitutional:      General: He is not in acute distress.  Appearance: He is well-developed. He is not toxic-appearing.  HENT:     Head: Normocephalic and atraumatic.  Neck:     Musculoskeletal: Normal range of motion and neck supple. No spinous process tenderness or muscular tenderness.  Cardiovascular:     Rate and Rhythm: Normal rate and regular rhythm.     Pulses: Normal pulses.  Pulmonary:     Effort: Pulmonary effort is normal. No respiratory distress.     Breath sounds: Normal breath sounds. No stridor. No wheezing, rhonchi or rales.  Abdominal:     General: There is no distension.     Palpations: Abdomen is soft.     Tenderness: There is no abdominal tenderness.  Musculoskeletal:      Right lower leg: No edema.     Left lower leg: No edema.     Comments: No obvious deformity, appreciable swelling, erythema, ecchymosis, significant open wounds, or increased warmth.  Extremities: Normal ROM. Nontender.  Back: No point/focal vertebral tenderness, no palpable step off or crepitus. Patient has mid right thoracic paraspinal muscle tenderness over the latissimus dorsi area.   Skin:    General: Skin is warm and dry.     Findings: No rash.  Neurological:     Mental Status: He is alert.     Deep Tendon Reflexes:     Reflex Scores:      Patellar reflexes are 2+ on the right side and 2+ on the left side.    Comments: Sensation grossly intact to bilateral upper/ lower extremities. 5/5 symmetric grip strength & strength with plantar/dorsiflexion bilaterally. Gait is intact without obvious foot drop.   Psychiatric:        Thought Content: Thought content does not include homicidal or suicidal ideation.     Comments: Patient does not appear to be responding to internal stimuli.     ED Treatments / Results  Labs (all labs ordered are listed, but only abnormal results are displayed) Labs Reviewed - No data to display  EKG None  Radiology No results found.  Procedures Procedures (including critical care time)  Medications Ordered in ED Medications  naproxen (NAPROSYN) tablet 500 mg (has no administration in time range)     Initial Impression / Assessment and Plan / ED Course  I have reviewed the triage vital signs and the nursing notes.  Pertinent labs & imaging results that were available during my care of the patient were reviewed by me and considered in my medical decision making (see chart for details).   Patient presents to the emergency department via EMS initially with no complaint, EMS to triage stated that patient was going to be late for curfew and called 911, he is on house arrest-, evaluation patient complaining of right upper back pain that is atraumatic.  Patient has normal neurologic exam, no point/focal midline tenderness to palpation, atraumatic doubt fx/dislocation. He is ambulatory in the ED.  No back pain red flags. No urinary sxs. No chest pain/dyspnea. PERC negative. Most likely muscle strain versus spasm- able to reproduce on palpation. Additionally considered disc disease, UTI/pyelonephritis, kidney stone, aortic aneurysm/dissection, cauda equina or epidural abscess however these do not fit clinical picture at this time. Will treat with Naproxen, recommended application of heat. He mentioned some increased stress/anxiety- no SI/HI/hallucinations @ this time to indicate need for TTS consultation- outpatient resource information provided. I discussed treatment plan, need for follow-up, and return precautions with the patient. Provided opportunity for questions, patient confirmed understanding and is in agreement with plan.  Findings and plan of care discussed with supervising physician Dr. Patria Maneampos who is in agreement.    Final Clinical Impressions(s) / ED Diagnoses   Final diagnoses:  Acute right-sided thoracic back pain    ED Discharge Orders         Ordered    naproxen (NAPROSYN) 500 MG tablet  2 times daily     05/07/19 2037           Desmond Lopeetrucelli, Natesha Hassey R, PA-C 05/07/19 2058    Azalia Bilisampos, Kevin, MD 05/07/19 2350

## 2019-05-07 NOTE — ED Triage Notes (Signed)
Per EMS, patient has no complaints. Called EMS because he realized he was going to be late for curfew and is on house arrest. Ambulatory.

## 2019-05-07 NOTE — ED Notes (Addendum)
Patient requesting to speak with MD and requesting detox guide. Patient cussing at staff.

## 2019-05-07 NOTE — ED Notes (Signed)
Campos MD at bedside. Provided with resource guide.

## 2019-05-07 NOTE — Discharge Instructions (Addendum)
You were seen in the emergency department for back pain today.  At this time we suspect that your pain is related to a muscle strain/spasm.   I have prescribed you an anti-inflammatory medication and a muscle relaxer.  - Naproxen is a nonsteroidal anti-inflammatory medication that will help with pain and swelling. Be sure to take this medication as prescribed with food, 1 pill every 12 hours,  It should be taken with food, as it can cause stomach upset, and more seriously, stomach bleeding. Do not take other nonsteroidal anti-inflammatory medications with this such as Advil, Motrin, Aleve, Mobic, Goodie Powder, or Motrin.    You make take Tylenol per over the counter dosing with these medications.   We have prescribed you new medication(s) today. Discuss the medications prescribed today with your pharmacist as they can have adverse effects and interactions with your other medicines including over the counter and prescribed medications. Seek medical evaluation if you start to experience new or abnormal symptoms after taking one of these medicines, seek care immediately if you start to experience difficulty breathing, feeling of your throat closing, facial swelling, or rash as these could be indications of a more serious allergic reaction   The application of heat can help soothe the pain.  Maintaining your daily activities, including walking, is encourged, as it will help you get better faster than just staying in bed.  Your pain should get better over the next 2 weeks.  You will need to follow up with  Your primary healthcare provider in 1-2 weeks for reassessment, if you do not have a primary care provider one is provided in your discharge instructions- you may see the [place] [facility] or call the provided phone number. However return to the ER should you develop ne or worsening symptoms or any other concerns including but not limited to severe or worsening pain, low back pain with fever, numbness,  weakness, loss of bowel or bladder control, or inability to walk or urinate, you should return to the ER immediately.

## 2019-05-21 ENCOUNTER — Other Ambulatory Visit: Payer: Self-pay

## 2019-05-21 ENCOUNTER — Emergency Department (HOSPITAL_COMMUNITY)
Admission: EM | Admit: 2019-05-21 | Discharge: 2019-05-21 | Disposition: A | Discharge status: Home / Self Care | Payer: Self-pay | Attending: Emergency Medicine | Admitting: Emergency Medicine

## 2019-05-21 ENCOUNTER — Encounter (HOSPITAL_COMMUNITY): Payer: Self-pay | Admitting: Emergency Medicine

## 2019-05-21 ENCOUNTER — Emergency Department (HOSPITAL_COMMUNITY): Payer: Self-pay

## 2019-05-21 DIAGNOSIS — F10920 Alcohol use, unspecified with intoxication, uncomplicated: Secondary | ICD-10-CM | POA: Insufficient documentation

## 2019-05-21 DIAGNOSIS — R6884 Jaw pain: Secondary | ICD-10-CM | POA: Insufficient documentation

## 2019-05-21 DIAGNOSIS — S0003XA Contusion of scalp, initial encounter: Secondary | ICD-10-CM | POA: Insufficient documentation

## 2019-05-21 DIAGNOSIS — Z23 Encounter for immunization: Secondary | ICD-10-CM | POA: Insufficient documentation

## 2019-05-21 DIAGNOSIS — J690 Pneumonitis due to inhalation of food and vomit: Secondary | ICD-10-CM | POA: Insufficient documentation

## 2019-05-21 DIAGNOSIS — R451 Restlessness and agitation: Secondary | ICD-10-CM | POA: Insufficient documentation

## 2019-05-21 DIAGNOSIS — Y903 Blood alcohol level of 60-79 mg/100 ml: Secondary | ICD-10-CM | POA: Insufficient documentation

## 2019-05-21 DIAGNOSIS — Y92003 Bedroom of unspecified non-institutional (private) residence as the place of occurrence of the external cause: Secondary | ICD-10-CM | POA: Insufficient documentation

## 2019-05-21 DIAGNOSIS — Y9389 Activity, other specified: Secondary | ICD-10-CM | POA: Insufficient documentation

## 2019-05-21 DIAGNOSIS — M549 Dorsalgia, unspecified: Secondary | ICD-10-CM | POA: Insufficient documentation

## 2019-05-21 DIAGNOSIS — S0990XA Unspecified injury of head, initial encounter: Secondary | ICD-10-CM

## 2019-05-21 DIAGNOSIS — Y999 Unspecified external cause status: Secondary | ICD-10-CM | POA: Insufficient documentation

## 2019-05-21 DIAGNOSIS — M542 Cervicalgia: Secondary | ICD-10-CM | POA: Insufficient documentation

## 2019-05-21 DIAGNOSIS — R410 Disorientation, unspecified: Secondary | ICD-10-CM | POA: Insufficient documentation

## 2019-05-21 DIAGNOSIS — R220 Localized swelling, mass and lump, head: Secondary | ICD-10-CM | POA: Insufficient documentation

## 2019-05-21 HISTORY — DX: Brief psychotic disorder: F23

## 2019-05-21 HISTORY — DX: Major depressive disorder, single episode, unspecified: F32.9

## 2019-05-21 LAB — COMPREHENSIVE METABOLIC PANEL WITH GFR
ALT: 51 U/L — ABNORMAL HIGH (ref 0–44)
AST: 61 U/L — ABNORMAL HIGH (ref 15–41)
Albumin: 4 g/dL (ref 3.5–5.0)
Alkaline Phosphatase: 41 U/L (ref 38–126)
Anion gap: 10 (ref 5–15)
BUN: 11 mg/dL (ref 6–20)
CO2: 21 mmol/L — ABNORMAL LOW (ref 22–32)
Calcium: 9.2 mg/dL (ref 8.9–10.3)
Chloride: 110 mmol/L (ref 98–111)
Creatinine, Ser: 1.27 mg/dL — ABNORMAL HIGH (ref 0.61–1.24)
GFR calc Af Amer: >60 mL/min
GFR calc non Af Amer: >60 mL/min
Glucose, Bld: 76 mg/dL (ref 70–99)
Potassium: 3.7 mmol/L (ref 3.5–5.1)
Sodium: 141 mmol/L (ref 135–145)
Total Bilirubin: 0.8 mg/dL (ref 0.3–1.2)
Total Protein: 6.5 g/dL (ref 6.5–8.1)

## 2019-05-21 LAB — I-STAT CHEM 8, ED
BUN: 14 mg/dL (ref 6–20)
Calcium, Ion: 1.2 mmol/L (ref 1.15–1.40)
Chloride: 109 mmol/L (ref 98–111)
Creatinine, Ser: 1.3 mg/dL — ABNORMAL HIGH (ref 0.61–1.24)
Glucose, Bld: 69 mg/dL — ABNORMAL LOW (ref 70–99)
HCT: 39 % (ref 39.0–52.0)
Hemoglobin: 13.3 g/dL (ref 13.0–17.0)
Potassium: 3.7 mmol/L (ref 3.5–5.1)
Sodium: 141 mmol/L (ref 135–145)
TCO2: 24 mmol/L (ref 22–32)

## 2019-05-21 LAB — CBC
HCT: 39.9 % (ref 39.0–52.0)
Hemoglobin: 13 g/dL (ref 13.0–17.0)
MCH: 32.7 pg (ref 26.0–34.0)
MCHC: 32.6 g/dL (ref 30.0–36.0)
MCV: 100.3 fL — ABNORMAL HIGH (ref 80.0–100.0)
Platelets: 235 10*3/uL (ref 150–400)
RBC: 3.98 MIL/uL — ABNORMAL LOW (ref 4.22–5.81)
RDW: 12 % (ref 11.5–15.5)
WBC: 7.4 10*3/uL (ref 4.0–10.5)
nRBC: 0 % (ref 0.0–0.2)

## 2019-05-21 LAB — URINALYSIS, ROUTINE W REFLEX MICROSCOPIC
Bilirubin Urine: NEGATIVE
Glucose, UA: NEGATIVE mg/dL
Hgb urine dipstick: NEGATIVE
Ketones, ur: NEGATIVE mg/dL
Leukocytes,Ua: NEGATIVE
Nitrite: NEGATIVE
Protein, ur: NEGATIVE mg/dL
Specific Gravity, Urine: 1.026 (ref 1.005–1.030)
pH: 6 (ref 5.0–8.0)

## 2019-05-21 LAB — RAPID URINE DRUG SCREEN, HOSP PERFORMED
Amphetamines: NOT DETECTED
Barbiturates: NOT DETECTED
Benzodiazepines: NOT DETECTED
Cocaine: NOT DETECTED
Opiates: NOT DETECTED
Tetrahydrocannabinol: POSITIVE — AB

## 2019-05-21 LAB — LACTIC ACID, PLASMA: Lactic Acid, Venous: 1.9 mmol/L (ref 0.5–1.9)

## 2019-05-21 LAB — PROTIME-INR
INR: 1 (ref 0.8–1.2)
Prothrombin Time: 13 seconds (ref 11.4–15.2)

## 2019-05-21 LAB — CK
Total CK: 1192 U/L — ABNORMAL HIGH (ref 49–397)
Total CK: 967 U/L — ABNORMAL HIGH (ref 49–397)

## 2019-05-21 LAB — CBG MONITORING, ED: Glucose-Capillary: 90 mg/dL (ref 70–99)

## 2019-05-21 LAB — SAMPLE TO BLOOD BANK

## 2019-05-21 LAB — ETHANOL: Alcohol, Ethyl (B): 62 mg/dL — ABNORMAL HIGH (ref <10)

## 2019-05-21 LAB — CDS SEROLOGY

## 2019-05-21 MED ORDER — SODIUM CHLORIDE 0.9 % IV BOLUS
1000.0000 mL | Freq: Once | INTRAVENOUS | Status: AC
  Administered 2019-05-21: 04:00:00 1000 mL via INTRAVENOUS

## 2019-05-21 MED ORDER — SODIUM CHLORIDE 0.9 % IV SOLN
3.0000 g | Freq: Once | INTRAVENOUS | Status: AC
  Administered 2019-05-21: 08:00:00 3 g via INTRAVENOUS
  Filled 2019-05-21: qty 3

## 2019-05-21 MED ORDER — SODIUM CHLORIDE 0.9 % IV SOLN: INTRAVENOUS | Status: DC

## 2019-05-21 MED ORDER — TRAMADOL HCL 50 MG PO TABS: 50.0000 mg | ORAL_TABLET | Freq: Four times a day (QID) | ORAL | 0 refills | Status: DC | PRN

## 2019-05-21 MED ORDER — TETANUS-DIPHTH-ACELL PERTUSSIS 5-2.5-18.5 LF-MCG/0.5 IM SUSP
0.5000 mL | Freq: Once | INTRAMUSCULAR | Status: AC
  Administered 2019-05-21: 0.5 mL via INTRAMUSCULAR
  Filled 2019-05-21: qty 0.5

## 2019-05-21 MED ORDER — DOXYCYCLINE HYCLATE 100 MG PO CAPS: 100.0000 mg | ORAL_CAPSULE | Freq: Two times a day (BID) | ORAL | 0 refills | Status: DC

## 2019-05-21 MED ORDER — AMOXICILLIN-POT CLAVULANATE 875-125 MG PO TABS: 1.0000 | ORAL_TABLET | Freq: Two times a day (BID) | ORAL | 0 refills | Status: DC

## 2019-05-21 MED ORDER — DEXTROSE 50 % IV SOLN: 25.0000 g | Freq: Once | INTRAVENOUS | Status: DC

## 2019-05-21 MED ORDER — IOHEXOL 300 MG/ML  SOLN
100.0000 mL | Freq: Once | INTRAMUSCULAR | Status: AC | PRN
  Administered 2019-05-21: 03:00:00 100 mL via INTRAVENOUS

## 2019-05-21 MED ORDER — SODIUM CHLORIDE 0.9 % IV BOLUS
1000.0000 mL | Freq: Once | INTRAVENOUS | Status: AC
  Administered 2019-05-21: 07:00:00 1000 mL via INTRAVENOUS

## 2019-05-21 NOTE — ED Notes (Signed)
Attempted to wake patient, pt remains very drowsy, unable to be fully aroused at this time. resp e/u, nad.

## 2019-05-21 NOTE — Progress Notes (Signed)
This chaplain's initial response to a Level 2 laceration in Trauma C was by phone. Gabby rec'd the call.  The chaplain later checked in with RN-Autumn.  The chaplain understands no Pt. needs at this time. The chaplain is available for F/U spiritual care as needed.

## 2019-05-21 NOTE — ED Notes (Signed)
Pt returned from CT, o2 sat 88-89% on RA, pt placed on 2L o2 by [place]. 92% on O2

## 2019-05-21 NOTE — ED Notes (Signed)
Attempted to walk pt, pt would not open eyes despite multiple attempts to wake pt.

## 2019-05-21 NOTE — ED Notes (Signed)
Patient verbalized understanding of dc instructions, vss, ambulatory with nad.   

## 2019-05-21 NOTE — ED Notes (Signed)
ED Provider at bedside. 

## 2019-05-21 NOTE — ED Provider Notes (Addendum)
Signed out by Dr Wyvonnia Dusky that ED workup, d/c instructions done, to d/c to home when up and about.   Po fluids, food. Ambulate in hall.  Recheck pt, vital signs normal. Pulse ox 99%. No increased wob. Pt comfortable.   Pt currently appears stable for d/c.   At d/c pt requests rx for pain at home, states he wants something other than otc meds, as he does not feel that will help.   rx small quantity ultram provided.        Lajean Saver, MD 05/21/19 1059
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
## 2019-05-21 NOTE — ED Notes (Signed)
Patient requesting to speak with dr Ashok Cordia before leaving
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
## 2019-05-22 ENCOUNTER — Encounter (HOSPITAL_COMMUNITY): Payer: Self-pay | Admitting: Emergency Medicine

## 2020-06-21 ENCOUNTER — Emergency Department (HOSPITAL_COMMUNITY)
Admission: EM | Admit: 2020-06-21 | Discharge: 2020-06-21 | Disposition: A | Discharge status: Home / Self Care | Payer: Medicaid - Out of State | Attending: Emergency Medicine | Admitting: Emergency Medicine

## 2020-06-21 ENCOUNTER — Other Ambulatory Visit: Payer: Self-pay

## 2020-06-21 ENCOUNTER — Encounter (HOSPITAL_COMMUNITY): Payer: Self-pay | Admitting: Emergency Medicine

## 2020-06-21 DIAGNOSIS — F22 Delusional disorders: Secondary | ICD-10-CM | POA: Insufficient documentation

## 2020-06-21 DIAGNOSIS — K640 First degree hemorrhoids: Secondary | ICD-10-CM | POA: Insufficient documentation

## 2020-06-21 DIAGNOSIS — Z79899 Other long term (current) drug therapy: Secondary | ICD-10-CM | POA: Insufficient documentation

## 2020-06-21 DIAGNOSIS — F17290 Nicotine dependence, other tobacco product, uncomplicated: Secondary | ICD-10-CM | POA: Insufficient documentation

## 2020-06-21 DIAGNOSIS — J45909 Unspecified asthma, uncomplicated: Secondary | ICD-10-CM | POA: Insufficient documentation

## 2020-06-21 MED ORDER — HYDROCORTISONE (PERIANAL) 2.5 % EX CREA: 1.0000 "application " | TOPICAL_CREAM | Freq: Two times a day (BID) | CUTANEOUS | 0 refills | Status: AC

## 2020-06-21 MED ORDER — VALACYCLOVIR HCL 1 G PO TABS: 1000.0000 mg | ORAL_TABLET | Freq: Every day | ORAL | 0 refills | Status: AC

## 2020-06-21 NOTE — Discharge Instructions (Signed)
Please take your medications, as directed.  I have placed a consult with case management who will call you to discuss outpatient management of your health and wellbeing.  It is vitally important that you get established with a primary care provider as well as outpatient psychiatric assistance.  Return to the ED or seek immediate medical attention should you experience any new or worsening symptoms.

## 2020-06-21 NOTE — ED Triage Notes (Signed)
Patient with multiple complaints, having the feeling of an insect bit him under his testicles, he has a breakout of HSV and he has a hemorrhoid that is bothering him.

## 2020-06-21 NOTE — ED Provider Notes (Signed)
[facility] EMERGENCY DEPARTMENT Provider Note   CSN: [id number] Arrival date & time: 06/21/20  1308     History No chief complaint on file.   Sean Sanford is a 29 y.o. male with PMH significant for HSV 1 and MDD with psychosis who presents the ED with multiple complaints.  Patient begins by telling me that he has had a recent herpes outbreak in his genital region.  He also is complaining of worsening hemorrhoids despite topical over-the-counter treatment.  Patient also is complaining of small white insects with "claws" that come out of his skin.  He also noticed a black web appear on his forearm and is endorsing formication.  Patient is in the process of trying to establish with [facility] outpatient mental health clinic.  Patient admits that he has used cocaine in the past, but denies any recent illicit drug use aside from smoking marijuana.  He is unsure as to whether or not it may have been laced with something.  Patient denies any HI or SI, fevers or chills, chest pain difficult breathing, abdominal pain, hematuria, melena or hematochezia, or other symptoms.   HPI     Past Medical History:  Diagnosis Date  . Asthma   . MDD (major depressive disorder)   . Schizophrenia, acute [facility])     Patient Active Problem List   Diagnosis Date Noted  . 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 No pertinent surgical history.     History reviewed. No pertinent family history.  Social History   Tobacco Use  . Smoking status: Current Every Day Smoker    Types: Cigars  . Smokeless tobacco: Never Used  Substance Use Topics  . Alcohol use: Yes  . Drug use: Not Currently    Home Medications Prior to Admission medications   Medication Sig Start Date End Date Taking? Authorizing Provider  albuterol (VENTOLIN HFA) 108 (90 Base) MCG/ACT inhaler Inhale 2 puffs into the lungs every 6 (six) hours as needed for  wheezing or shortness of breath. 04/07/19   Malvin Johns, MD  amoxicillin-clavulanate (AUGMENTIN) 875-125 MG tablet Take 1 tablet by mouth 2 (two) times daily. 05/21/19   Rancour, Jeannett Senior, MD  busPIRone (BUSPAR) 15 MG tablet Take 1 tablet (15 mg total) by mouth 3 (three) times daily. 04/07/19   Malvin Johns, MD  doxycycline (VIBRAMYCIN) 100 MG capsule Take 1 capsule (100 mg total) by mouth 2 (two) times daily. 05/21/19   Rancour, Jeannett Senior, MD  FLUoxetine (PROZAC) 20 MG capsule Take 1 capsule (20 mg total) by mouth daily. 04/07/19   Malvin Johns, MD  hydrocortisone (ANUSOL-HC) 2.5 % rectal cream Place 1 application rectally 2 (two) times daily. 06/21/20   Lorelee New, PA-C  naproxen (NAPROSYN) 500 MG tablet Take 1 tablet (500 mg total) by mouth 2 (two) times daily. 05/07/19   Petrucelli, Samantha R, PA-C  traMADol (ULTRAM) 50 MG tablet Take 1 tablet (50 mg total) by mouth every 6 (six) hours as needed. 05/21/19   Cathren Laine, MD  valACYclovir (VALTREX) 1000 MG tablet Take 1 tablet (1,000 mg total) by mouth daily for 5 days. 06/21/20 06/26/20  Lorelee New, PA-C  zolpidem (AMBIEN) 10 MG tablet Take 1 tablet (10 mg total) by mouth at bedtime. 04/07/19   Malvin Johns, MD    Allergies    Patient has no known allergies.  Review of Systems   Review of Systems  All other systems reviewed and are  negative.   Physical Exam Updated Vital Signs BP 122/76 (BP Location: Right Arm)   Pulse (!) 55   Temp 97.9 F (36.6 C) (Oral)   Resp 18   SpO2 100%   Physical Exam Vitals and nursing note reviewed. Exam conducted with a chaperone present.  Constitutional:      Appearance: Normal appearance. He is not ill-appearing.  HENT:     Head: Normocephalic and atraumatic.  Eyes:     General: No scleral icterus.    Conjunctiva/sclera: Conjunctivae normal.  Cardiovascular:     Rate and Rhythm: Normal rate and regular rhythm.     Pulses: Normal pulses.     Heart sounds: Normal heart sounds.  Pulmonary:      Effort: Pulmonary effort is normal.  Genitourinary:    Comments: Small hemorrhoid noted on posterior aspect of the anus.  There is also one small ulcerative lesion on shaft of penis.  No testicular pain or swelling.  No balanitis.  Otherwise normal exam. Musculoskeletal:        General: Normal range of motion.     Cervical back: Normal range of motion.  Skin:    General: Skin is dry.     Capillary Refill: Capillary refill takes less than 2 seconds.     Findings: No rash.  Neurological:     General: No focal deficit present.     Mental Status: He is alert and oriented to person, place, and time.     GCS: GCS eye subscore is 4. GCS verbal subscore is 5. GCS motor subscore is 6.     Cranial Nerves: No cranial nerve deficit.     Sensory: No sensory deficit.     Motor: No weakness.     Coordination: Coordination normal.  Psychiatric:        Mood and Affect: Mood normal.        Behavior: Behavior normal.        Thought Content: Thought content normal.     ED Results / Procedures / Treatments   Labs (all labs ordered are listed, but only abnormal results are displayed) Labs Reviewed - No data to display  EKG None  Radiology No results found.  Procedures Procedures (including critical care time)  Medications Ordered in ED Medications - No data to display  ED Course  I have reviewed the triage vital signs and the nursing notes.  Pertinent labs & imaging results that were available during my care of the patient were reviewed by me and considered in my medical decision making (see chart for details).    MDM Rules/Calculators/A&P                          Patient presented to the ED with multiple complaints.  He has history of MDD with psychosis and he has a 2-day history of delusional parasitosis.  He denies any obvious illicit drug use, but admits that he recently smoked marijuana that could have potentially been laced.  Otherwise, could be attributed to his known mental  health disease.  Patient reports that he is in the process of any established with [facility], but is running into insurance issues.  We will consult with case management to follow-up with patient.  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  We will treat with valacyclovir.  We will also provide patient with Anusol given his hemorrhoid.  Encouraged him to consume  a high-fiber diet and take over-the-counter stool softeners as needed.  Also encouraged him to elevate his feet during defecation to help reduce straining.  His hemorrhoid was easily reducible and grade 1.  Patient is in no distress.  Strict ED return precautions discussed. Patient voices understanding and is agreeable to the plan.    Final Clinical Impression(s) / ED Diagnoses Final diagnoses:  Ekbom's delusional parasitosis (HCC)  Grade I hemorrhoids    Rx / DC Orders ED Discharge Orders         Ordered    hydrocortisone (ANUSOL-HC) 2.5 % rectal cream  2 times daily        06/21/20 1040    valACYclovir (VALTREX) 1000 MG tablet  Daily        06/21/20 1040           Elvera Maria 06/21/20 1041    Arby Barrette, MD 06/24/20 [phone]

## 2020-06-28 ENCOUNTER — Ambulatory Visit (HOSPITAL_COMMUNITY): Payer: No Payment, Other | Admitting: Clinical

## 2020-06-28 ENCOUNTER — Other Ambulatory Visit: Payer: Self-pay

## 2020-06-28 ENCOUNTER — Telehealth (HOSPITAL_COMMUNITY): Payer: Self-pay | Admitting: Clinical

## 2020-06-28 NOTE — Telephone Encounter (Signed)
Therapist sent the client a link for the MyChart video visit via text message to the clients mobile number on file. Client was able to check in but his audio and picture were not clear. Therapist followed up and spoke with the client via mobile number and informed him to present during walk in hours on Friday between 1pm -4pm to have an assessment completed. Therapist provided the client with the address and office tele -phone number. Client was in agreement.

## 2020-07-17 ENCOUNTER — Encounter (HOSPITAL_COMMUNITY): Payer: Self-pay | Admitting: Psychiatry

## 2020-07-17 ENCOUNTER — Telehealth (INDEPENDENT_AMBULATORY_CARE_PROVIDER_SITE_OTHER): Payer: No Payment, Other | Admitting: Psychiatry

## 2020-07-17 ENCOUNTER — Other Ambulatory Visit: Payer: Self-pay

## 2020-07-17 DIAGNOSIS — F3161 Bipolar disorder, current episode mixed, mild: Secondary | ICD-10-CM

## 2020-07-17 MED ORDER — OLANZAPINE 10 MG PO TABS: 10.0000 mg | ORAL_TABLET | Freq: Every day | ORAL | 1 refills | Status: DC

## 2020-07-17 MED ORDER — HYDROXYZINE HCL 25 MG PO TABS: 25.0000 mg | ORAL_TABLET | Freq: Three times a day (TID) | ORAL | 1 refills | Status: DC | PRN

## 2020-07-17 NOTE — Progress Notes (Signed)
Psychiatric Initial Adult Assessment   Virtual Visit via Video Note  I connected with Sean Sanford on 07/17/20 at  1:40 PM EDT by a video enabled telemedicine application and verified that I am speaking with the correct person using two identifiers.  Location: Patient: Home Provider: Clinic   I discussed the limitations of evaluation and management by telemedicine and the availability of in person appointments. The patient expressed understanding and agreed to proceed.  I provided 28 minutes of non-face-to-face time during this encounter. Extensive amount of time spent in reviewing the EMR.     Patient Identification: Sean Sanford MRN:  [id number] Date of Evaluation:  07/17/2020   Referral Source: [facility]  Chief Complaint:   " I really need to be back on my medicines for the legal issues I have."  Visit Diagnosis:    ICD-10-CM   1. Bipolar 1 disorder, mixed, mild (HCC)  F31.61 OLANZapine (ZYPREXA) 10 MG tablet    hydrOXYzine (ATARAX/VISTARIL) 25 MG tablet    History of Present Illness: This is a 29 year old male with history of bipolar disorder and cannabis abuse now seen for establishing care.  [facility]. Patient was noted to be a poor historian and Clinical research associatewriter had to redirect him and asked same question several times to get the answer. Based on numerous different accounts of the patient narrated the writer understood that patient is originally from [place] area where he also has Medicaid.  He came down to [place] in June to take care of some legal issues because when he was here last year he got into several different legal problems locally.  He had come down here in June to resolve those issues however at the time of his hearing the judge put him on probation and as a result he was not allowed to leave the state.  Ever since then he has been off of his medications because he did not bring his bottles of olanzapine and hydroxyzine from  [place].  He also claimed that he was getting Adderall from the streets because that helped him significantly.  Upon probing it became clear that the Adderall was not prescribed to him and that he had gotten it from a friend. Patient insisted that the writer prescribes him with olanzapine and Adderall because this combination has worked for him really well. He stated that his attorney has informed him that he needs to be back on his medications " legally or illegally" because he does not believe the patient is doing well. Patient was noted to have circumstantial thought process with illogical contact. He had a hard time explaining everything to the Clinical research associatewriter. Patient stated that currently he is dealing with all the legal stuff and really wants to be back on his medicine namely olanzapine.  The Clinical research associatewriter informed him that Clinical research associatewriter would not be prescribing him Adderall.  Upon hearing that he asked if he could be prescribed something for anxiety like hydroxyzine because he took it in the past and that was helpful. He did mention that he has been [facility] in the past.  The writer corroborated that and noted that he was hospitalized in [facility] in [facility] and at that time he was given the diagnosis of MDD.  Patient reported that he has had several periods of elated mood, elevated energy levels, impulsivity, racing thoughts, decreased need for sleep, lot of impulsive and grandiose decisions. He also reported having periods of  time when he feels depressed with low energy levels and helpless. He has had suicidal ideations in the past.  He denied any current suicidal or homicidal ideations.  He also mentioned that he has a 39-month-old daughter who currently resides with her mother in [place], [place].  He informed that he sees her sometimes and that she started daycare today.  He denied any auditory or visual hallucinations or paranoid delusions.  He stated that he has transportation issues however  he can use the public transportation to get to the pharmacy that will offer him the cheapest prices for his medications.  He stated that he was hoping that he can get them for free however because he [place] and [place] he will have to pay some amount out of his pocket.  He does not have a vehicle here.  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  He informed that his parents originally from [place] and that unless he is also his legal issues in [place] his father will not allow him to return back to his house.  Past Psychiatric History: Bipolar disorder  Previous Psychotropic Medications: Yes   Substance Abuse History in the last 12 months:  Yes.    Consequences of Substance Abuse: NA  Past Medical History:  Past Medical History:  Diagnosis Date  . Asthma   . MDD (major depressive disorder)   . Schizophrenia, acute (HCC)    No past surgical history on file.  Family Psychiatric History: denied  Family History: No family history on file.  Social History:   Social History   Socioeconomic History  . Marital status: Single    Spouse name: Not on file  . Number of children: Not on file  . Years of education: Not on file  . Highest education level: Not on file  Occupational History  . Not on file  Tobacco Use  . Smoking status: Current Every Day Smoker    Types: Cigars  . Smokeless tobacco: Never Used  Substance and Sexual Activity  . Alcohol use: Yes  . Drug use: Not Currently  . Sexual activity: Not on file  Other Topics Concern  . Not on file  Social History Narrative   ** Merged History Encounter **       Social Determinants of Health   Financial Resource Strain:   . Difficulty of Paying Living Expenses: Not on file  Food Insecurity:   . Worried About Programme researcher, broadcasting/film/video in the Last Year: Not on file  . Ran Out of Food in the Last Year: Not on file  Transportation Needs:   . Lack of Transportation (Medical):  Not on file  . Lack of Transportation (Non-Medical): Not on file  Physical Activity:   . Days of Exercise per Week: Not on file  . Minutes of Exercise per Session: Not on file  Stress:   . Feeling of Stress : Not on file  Social Connections:   . Frequency of Communication with Friends and Family: Not on file  . Frequency of Social Gatherings with Friends and Family: Not on file  . Attends Religious Services: Not on file  . Active Member of Clubs or Organizations: Not on file  . Attends Banker Meetings: Not on file  . Marital Status: Not on file    Additional Social History: Working several odd jobs  Allergies:  No Known Allergies  Metabolic Disorder Labs: Lab Results  Component Value Date  HGBA1C 4.2 (L) 04/06/2019   MPG 73.84 04/06/2019   No results found for: PROLACTIN Lab Results  Component Value Date   CHOL 167 04/06/2019   TRIG 71 04/06/2019   HDL 82 04/06/2019   CHOLHDL 2.0 04/06/2019   VLDL 14 04/06/2019   LDLCALC NOT CALCULATED 04/06/2019   Lab Results  Component Value Date   TSH 1.248 04/06/2019    Therapeutic Level Labs: No results found for: LITHIUM No results found for: CBMZ No results found for: VALPROATE  Current Medications: Current Outpatient Medications  Medication Sig Dispense Refill  . albuterol (VENTOLIN HFA) 108 (90 Base) MCG/ACT inhaler Inhale 2 puffs into the lungs every 6 (six) hours as needed for wheezing or shortness of breath. 1 Inhaler 2  . hydrocortisone (ANUSOL-HC) 2.5 % rectal cream Place 1 application rectally 2 (two) times daily. 30 g 0  . hydrOXYzine (ATARAX/VISTARIL) 25 MG tablet Take 1 tablet (25 mg total) by mouth 3 (three) times daily as needed for anxiety. 90 tablet 1  . OLANZapine (ZYPREXA) 10 MG tablet Take 1 tablet (10 mg total) by mouth at bedtime. 30 tablet 1   No current facility-administered medications for this visit.     Psychiatric Specialty Exam: Review of Systems  There were no vitals taken  for this visit.There is no height or weight on file to calculate BMI.  General Appearance: Fairly Groomed  Eye Contact:  Good  Speech:  Clear and Coherent and Normal Rate  Volume:  Normal  Mood:  Anxious  Affect:  Congruent  Thought Process:  Disorganized and Descriptions of Associations: Circumstantial  Orientation:  Full (Time, Place, and Person)  Thought Content:  Illogical and Rumination  Suicidal Thoughts:  No  Homicidal Thoughts:  No  Memory:  Immediate;   Good Recent;   Good  Judgement:  Fair  Insight:  Lacking  Psychomotor Activity:  Normal  Concentration:  Concentration: Fair and Attention Span: Fair  Recall:  Good  Fund of Knowledge:Good  Language: Good  Akathisia:  Negative  Handed:  Right  AIMS (if indicated):  not done  Assets:  Communication Skills Desire for Improvement Financial Resources/Insurance Housing Physical Health  ADL's:  Intact  Cognition: WNL  Sleep:  Fair   Screenings: AIMS     [facility]  AIMS Total Score 0    AUDIT     Admission (Discharged) [facility]  Alcohol Use Disorder Identification Test Final Score (AUDIT) 0      Assessment and Plan: 29 year old male with history of bipolar disorder and cannabis use now seen for establishing care.  Patient was noted to be a poor historian with circumstantial thought process.  He informed that he found olanzapine to be very helpful for his mood and he was taking that in [place] before he came down to [place] in June.  He requested refills for that and also requested hydroxyzine refills to help with anxiety.  He is not sure how long he is going to be here but was agreeable with a follow-up ointment scheduled in 2 months. After a long discussion with the patient writer suggested that based on the descriptions of [place] pharmacy as he is  familiar with them.  1. Bipolar 1 disorder, mixed, mild (HCC) - OLANZapine (ZYPREXA) 10 MG tablet; Take 1 tablet (10 mg total) by mouth at bedtime.  Dispense: 30 tablet; Refill:  1 - hydrOXYzine (ATARAX/VISTARIL) 25 MG tablet; Take 1 tablet (25 mg total) by mouth 3 (three) times daily as needed for anxiety.  Dispense: 90 tablet; Refill: 1  F/up in 2 months.  Zena Amos, MD 9/28/20212:04 PM

## 2020-08-10 ENCOUNTER — Ambulatory Visit (HOSPITAL_COMMUNITY)
Admission: EM | Admit: 2020-08-10 | Discharge: 2020-08-10 | Disposition: A | Discharge status: Home / Self Care | Payer: No Payment, Other | Attending: Psychiatry | Admitting: Psychiatry

## 2020-08-10 ENCOUNTER — Other Ambulatory Visit: Payer: Self-pay

## 2020-08-10 DIAGNOSIS — Z760 Encounter for issue of repeat prescription: Secondary | ICD-10-CM | POA: Insufficient documentation

## 2020-08-10 DIAGNOSIS — F3161 Bipolar disorder, current episode mixed, mild: Secondary | ICD-10-CM

## 2020-08-10 NOTE — ED Notes (Signed)
Nurse Discharge Note:  D:Patient denies SI/HI/AVH at this time. Pt appears calm and cooperative, and no distress noted.  A: All Personal items in locker returned to pt. Pt given AVS/ Follow-Up appointment/Prescriptions.  Pt escorted to meet safe transport driver.  R:  Pt States he will comply with outpatient  services, and take medications as prescribed.

## 2020-08-10 NOTE — ED Triage Notes (Signed)
Patient denies SI/HI. Patient states he is here to get his medication. Patient states he has been off medications since May.

## 2020-08-10 NOTE — BH Assessment (Signed)
Comprehensive Clinical Assessment (CCA) Screening, Triage and Referral Note  08/10/2020 Sean Sanford [id number]  Presents this date requesting assistance with medication management for ongoing mental health symptoms. Patient is also requesting assistance with filing for disability. Patient denies any S/I, H/I or AVH. Patient has a history of  bipolar disorder and has seen [facility] in the past who assisted with medication management. Patient was prescribed medications from [facility] on  07/17/20 (see Kaur's note of that date) although states he was unable to have those medications filled due to financial issues. Patient states he is originally from [place] area where he also has Medicaid. He came down to [place] in June to take care of some legal issues. Per chart review patient was  hospitalized at [facility] in June 2020 at which time he was diagnosed with MDD. Patient reports multiple stressors to include: financial issues and the responsibilities of having a 24-month-old daughter who currently resides with her mother in [place], [place]. Patient this date is contracting for safety and denies any auditory or visual hallucinations or paranoid delusions. Patient reports he has transportation issues which limits his ability to get to a pharmacy or medical appointments. Money NP evaluated patient and [facility] assisted patient by having him transported by [employer] to [place] who will assist with medications needs. Patient will follow up with OP services at [facility] on Monday 25, 2021 to assist with ongoing needs.   Visit Diagnosis: Bipolar 1 disorder   ICD-10-CM   1. Bipolar 1 disorder, mixed, mild (HCC)  F31.61     Patient Reported Information How did you hear about [place]? Self   Referral name: No data recorded  Referral phone number: No data recorded Whom do you see for routine medical problems? I don't have a doctor   Practice/Facility Name: No data recorded  Practice/Facility Phone Number: No data  recorded  Name of Contact: No data recorded  Contact Number: No data recorded  Contact Fax Number: No data recorded  Prescriber Name: No data recorded  Prescriber Address (if known): No data recorded What Is the Reason for Your Visit/Call Today? Medication refill  How Long Has This Been Causing You Problems? 1 wk - 1 month  Have You Recently Been in Any Inpatient Treatment (Hospital/Detox/Crisis Center/28-Day Program)? No   Name/Location of Program/Hospital:No data recorded  How Long Were You There? No data recorded  When Were You Discharged? No data recorded Have You Ever Received Services From [facility] Before? Yes   Who Do You [facility] at [facility]? Evelene Croon MD  Have You Recently Had Any Thoughts About Hurting Yourself? No   Are You Planning to Commit Suicide/Harm Yourself At This time?  No  Have you Recently Had Thoughts About Hurting Someone Karolee Ohs? No   Explanation: No data recorded Have You Used Any Alcohol or Drugs in the Past 24 Hours? No   How Long Ago Did You Use Drugs or Alcohol?  No data recorded  What Did You Use and How Much? No data recorded What Do You Feel Would Help You the Most Today? Medication  Do You Currently Have a Therapist/Psychiatrist? No   Name of Therapist/Psychiatrist: No data recorded  Have You Been Recently Discharged From Any Office Practice or Programs? No   Explanation of Discharge From Practice/Program:  No data recorded    CCA Screening Triage Referral Assessment Type of Contact: Face-to-Face   Is this Initial or Reassessment? No data recorded  Date Telepsych consult ordered in [facility]:  No data recorded  Time Telepsych consult ordered in [facility]:  No data recorded Patient Reported Information Reviewed? Yes   Patient Left Without Being Seen? No data recorded  Reason for Not Completing Assessment: No data recorded Collateral Involvement: No data recorded Does Patient Have a Court Appointed Legal Guardian? No data recorded  Name and Contact of  Legal Guardian:  No data recorded If Minor and Not Living with Parent(s), Who has Custody? No data recorded Is CPS involved or ever been involved? Never  Is APS involved or ever been involved? Never  Patient Determined To Be At Risk for Harm To Self or Others Based on Review of Patient Reported Information or Presenting Complaint? No   Method: No data recorded  Availability of Means: No data recorded  Intent: No data recorded  Notification Required: No data recorded  Additional Information for Danger to Others Potential:  No data recorded  Additional Comments for Danger to Others Potential:  No data recorded  Are There Guns or Other Weapons in Your Home?  No data recorded   Types of Guns/Weapons: No data recorded   Are These Weapons Safely Secured?                              No data recorded   Who Could Verify You Are Able To Have These Secured:    No data recorded Do You Have any Outstanding Charges, Pending Court Dates, Parole/Probation? No data recorded Contacted To Inform of Risk of Harm To Self or Others: Other: Comment (NA)  Location of Assessment: [facility] Assessment Services  Does Patient Present under Involuntary Commitment? No   IVC Papers Initial File Date: No data recorded  [place] of Residence: [place]  Patient Currently Receiving the Following Services: Not Receiving Services   Determination of Need: No data recorded  Options For Referral: Medication Management   Alfredia Ferguson, LCAS

## 2020-08-10 NOTE — ED Provider Notes (Addendum)
[facility] Medical Screening Exam  Patient Name: Sean Sanford MRN: [id number] Date of Evaluation: 08/10/20 Chief Complaint:   Diagnosis:  Final diagnoses:  None    History of Present illness: Sean Sanford is a 29 y.o. male.  Patient presents voluntarily as a walk-in and was brought to the [facility] via Patent examiner. He reports that he was brought in because his social worker called the police to bring him to the beehive because he has not been able to get his medications.  Patient reports he has been speaking them about getting on disability.  He states that he does try to work but between paying for daycare for his 26-month-old daughter and paying his fees for probation he has not had any Aceyn Kathol left over.  He reports that he knows that he should at least keep $20 out to pay for his medications.  He states his medications are at [facility] and they are $17.  Patient denies any suicidal homicidal ideations and denies any hallucinations.  Patient presents alert and oriented and has a logical and informative conversation.  Patient does not appear to be responding to any internal or external stimuli.  I have contacted [place] pharmacy myself and spoke to them about the patient's medications.  They are willing to charge the patient for $25 maximum so that he can receive his medications.  They state they close at 5 PM and we will be providing transportation for the patient to the pharmacy to get his medications today.  The patient is followed by Dr. Evelene Croon at the [facility].  At this time the patient does not meet inpatient psychiatric treatment criteria and is psychiatric cleared.  Safety plan has been established with the patient to get his medications with transportation.  Patient was also provided with a bus pass for transportation to his home once he finishes at the pharmacy.  Psychiatric Specialty Exam  Presentation  General Appearance:Appropriate for Environment;Casual  Eye  Contact:Good  Speech:Clear and Coherent;Normal Rate  Speech Volume:Normal  Handedness:Right   Mood and Affect  Mood:Euthymic  Affect:Congruent;Appropriate   Thought Process  Thought Processes:Coherent  Descriptions of Associations:Intact  Orientation:Full (Time, Place and Person)  Thought Content:WDL  Hallucinations:None  Ideas of Reference:None  Suicidal Thoughts:No  Homicidal Thoughts:No   Sensorium  Memory:Immediate Good;Recent Good;Remote Good  Judgment:Fair  Insight:Fair   Executive Functions  Concentration:Good  Attention Span:Good  Recall:Good  Fund of Knowledge:Good  Language:Good   Psychomotor Activity  Psychomotor Activity:Normal   Assets  Assets:Communication Skills;Desire for Improvement;Housing;Physical Health;Social Support;Transportation;Vocational/Educational   Sleep  Sleep:Fair  Number of hours: No data recorded  Physical Exam: Physical Exam Vitals and nursing note reviewed.  Constitutional:      Appearance: He is well-developed.  HENT:     Head: Normocephalic.  Eyes:     Pupils: Pupils are equal, round, and reactive to light.  Cardiovascular:     Rate and Rhythm: Normal rate.  Pulmonary:     Effort: Pulmonary effort is normal.  Musculoskeletal:        General: Normal range of motion.  Neurological:     Mental Status: He is alert and oriented to person, place, and time.    Review of Systems  Constitutional: Negative.   HENT: Negative.   Eyes: Negative.   Respiratory: Negative.   Cardiovascular: Negative.   Gastrointestinal: Negative.   Genitourinary: Negative.   Musculoskeletal: Negative.   Skin: Negative.   Neurological: Negative.   Endo/Heme/Allergies: Negative.   Psychiatric/Behavioral: Negative.  Blood pressure 125/89, pulse (!) 59, temperature 97.7 F (36.5 C), temperature source Temporal, resp. rate 18, height 5\' 11"  (1.803 m), weight 172 lb (78 kg), SpO2 100 %. Body mass index is 23.99  kg/m.  Musculoskeletal: Strength & Muscle Tone: within normal limits Gait & Station: normal Patient leans: N/A   [facility] MSE Discharge Disposition for Follow up and Recommendations: Based on my evaluation the patient does not appear to have an emergency medical condition and can be discharged with resources and follow up care in outpatient services for Medication Management and Individual Therapy   , FNP 08/10/2020, 4:03 PM

## 2020-08-10 NOTE — ED Notes (Signed)
LOCKER [address] 

## 2020-08-10 NOTE — Discharge Instructions (Addendum)

## 2020-08-10 NOTE — ED Notes (Signed)
Safe transport called 

## 2020-09-18 ENCOUNTER — Telehealth (HOSPITAL_COMMUNITY): Payer: No Payment, Other | Admitting: Psychiatry

## 2020-09-18 ENCOUNTER — Other Ambulatory Visit: Payer: Self-pay

## 2020-10-04 ENCOUNTER — Other Ambulatory Visit: Payer: Self-pay

## 2020-10-04 ENCOUNTER — Telehealth (INDEPENDENT_AMBULATORY_CARE_PROVIDER_SITE_OTHER): Payer: No Payment, Other | Admitting: Psychiatry

## 2020-10-04 ENCOUNTER — Encounter (HOSPITAL_COMMUNITY): Payer: Self-pay | Admitting: Psychiatry

## 2020-10-04 DIAGNOSIS — F3161 Bipolar disorder, current episode mixed, mild: Secondary | ICD-10-CM

## 2020-10-04 DIAGNOSIS — F319 Bipolar disorder, unspecified: Secondary | ICD-10-CM | POA: Diagnosis not present

## 2020-10-04 MED ORDER — BUPROPION HCL ER (XL) 150 MG PO TB24: 150.0000 mg | ORAL_TABLET | ORAL | 1 refills | Status: DC

## 2020-10-04 MED ORDER — OLANZAPINE 10 MG PO TABS: 10.0000 mg | ORAL_TABLET | Freq: Every day | ORAL | 1 refills | Status: DC

## 2020-10-04 MED ORDER — HYDROXYZINE HCL 25 MG PO TABS: 25.0000 mg | ORAL_TABLET | Freq: Two times a day (BID) | ORAL | 1 refills | Status: DC | PRN

## 2020-10-04 NOTE — Progress Notes (Signed)
BH OP Progress Note   Virtual Visit via Telephone Note  I connected with Sean Sanford on 10/04/20 at  9:40 AM EST by telephone and verified that I am speaking with the correct person using two identifiers.  Location: Patient: home Provider: Clinic   I discussed the limitations, risks, security and privacy concerns of performing an evaluation and management service by telephone and the availability of in person appointments. I also discussed with the patient that there may be a patient responsible charge related to this service. The patient expressed understanding and agreed to proceed.   I provided 15 minutes of non-face-to-face time during this encounter.      Patient Identification: Sean Sanford MRN:  [id number] Date of Evaluation:  10/04/2020    Chief Complaint:   " I am still going through tough legal issues."  Visit Diagnosis:    ICD-10-CM   1. Bipolar 1 disorder (HCC)  F31.9     History of Present Illness: Patient stated that he still dealing with his legal challenges.  He has another court date coming up.  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  He stated that he is trying to look for job on the side.  He stated that he feels depressed and down at times.  He feels overwhelmed due to all the legal troubles.  He feels like he has no energy to do anything at times.  He also reported poor concentration. He stated that the olanzapine and hydroxyzine helped however the hydroxyzine makes him kind of sleepy during the daytime so he does not really take it in the daytime. He stated he looked up his condition bipolar disorder and noted that lithium seems to be a good option for people like him. Writer noted that patient seems to be reporting depressive symptoms and seems to be depressed.  Patient agreed with that.  Writer suggested that we try an antidepressant instead of lithium.  Writer explained to the patient that lithium does have antidepressant  properties however it does require regular lab monitoring and also requires for the patient to ensure that they are regularly hydrated and taking good care of their oral fluid intake. Writer suggested we try an antidepressant and Wellbutrin XL was offered to the patient.  Patient stated that he was willing to try it.   Past Psychiatric History: Bipolar disorder   Past Medical History:  Past Medical History:  Diagnosis Date  . Asthma   . MDD (major depressive disorder)   . Schizophrenia, acute (HCC)    No past surgical history on file.  Family Psychiatric History: denied  Family History: No family history on file.  Social History:   Social History   Socioeconomic History  . Marital status: Single    Spouse name: Not on file  . Number of children: Not on file  . Years of education: Not on file  . Highest education level: Not on file  Occupational History  . Not on file  Tobacco Use  . Smoking status: Current Every Day Smoker    Types: Cigars  . Smokeless tobacco: Never Used  Substance and Sexual Activity  . Alcohol use: Yes  . Drug use: Not Currently  . Sexual activity: Not on file  Other Topics Concern  . Not on file  Social History Narrative   ** Merged History Encounter **       Social Determinants of Health   Financial Resource Strain: Not on [employer]  Insecurity: Not on file  Transportation Needs: Not on file  Physical Activity: Not on file  Stress: Not on file  Social Connections: Not on file    Additional Social History: Working several odd jobs  Allergies:  No Known Allergies  Metabolic Disorder Labs: Lab Results  Component Value Date   HGBA1C 4.2 (L) 04/06/2019   MPG 73.84 04/06/2019   No results found for: PROLACTIN Lab Results  Component Value Date   CHOL 167 04/06/2019   TRIG 71 04/06/2019   HDL 82 04/06/2019   CHOLHDL 2.0 04/06/2019   VLDL 14 04/06/2019   LDLCALC NOT CALCULATED 04/06/2019   Lab Results  Component Value Date   TSH  1.248 04/06/2019    Therapeutic Level Labs: No results found for: LITHIUM No results found for: CBMZ No results found for: VALPROATE  Current Medications: Current Outpatient Medications  Medication Sig Dispense Refill  . albuterol (VENTOLIN HFA) 108 (90 Base) MCG/ACT inhaler Inhale 2 puffs into the lungs every 6 (six) hours as needed for wheezing or shortness of breath. 1 Inhaler 2  . hydrocortisone (ANUSOL-HC) 2.5 % rectal cream Place 1 application rectally 2 (two) times daily. 30 g 0  . hydrOXYzine (ATARAX/VISTARIL) 25 MG tablet Take 1 tablet (25 mg total) by mouth 3 (three) times daily as needed for anxiety. 90 tablet 1  . OLANZapine (ZYPREXA) 10 MG tablet Take 1 tablet (10 mg total) by mouth at bedtime. 30 tablet 1   No current facility-administered medications for this visit.     Psychiatric Specialty Exam: Review of Systems  There were no vitals taken for this visit.There is no height or weight on file to calculate BMI.  General Appearance: unable to assess due to phone visit  Eye Contact:  unable to assess due to phone visit  Speech:  Clear and Coherent and Normal Rate  Volume:  Normal  Mood:  Anxious, depressed  Affect:  Congruent  Thought Process:  Goal Directed and Descriptions of Associations: Intact  Orientation:  Full (Time, Place, and Person)  Thought Content:  Logical, denied any paranoid delusions  Suicidal Thoughts:  No  Homicidal Thoughts:  No  Memory:  Immediate;   Good Recent;   Good  Judgement:  Fair  Insight:  Lacking  Psychomotor Activity:  Normal  Concentration:  Concentration: Fair and Attention Span: Fair  Recall:  Good  Fund of Knowledge:Good  Language: Good  Akathisia:  Negative  Handed:  Right  AIMS (if indicated):  not done  Assets:  Communication Skills Desire for Improvement Financial Resources/Insurance Housing Physical Health  ADL's:  Intact  Cognition: WNL  Sleep:  Good   Screenings: AIMS   Flowsheet Row Admission  (Discharged) [facility]  AIMS Total Score 0    AUDIT   Flowsheet Row Admission (Discharged) from [facility]  Alcohol Use Disorder Identification Test Final Score (AUDIT) 0      Assessment and Plan: Patient seems to be depressed due to his ongoing legal challenges.  He is trying hard to take care of his pending legal proceedings.  We will add Wellbutrin to help his depressive symptoms.  We will continue the same dose of olanzapine and cut down the dose of hydroxyzine to twice a day.  1. Bipolar 1 disorder (HCC)  - hydrOXYzine (ATARAX/VISTARIL) 25 MG tablet; Take 1 tablet (25 mg total) by mouth 2 (two) times daily as  needed for anxiety.  Dispense: 60 tablet; Refill: 1 - OLANZapine (ZYPREXA) 10 MG tablet; Take 1 tablet (10 mg total) by mouth at bedtime.  Dispense: 30 tablet; Refill: 1 - buPROPion (WELLBUTRIN XL) 150 MG 24 hr tablet; Take 1 tablet (150 mg total) by mouth every morning.  Dispense: 30 tablet; Refill: 1   F/up in 2 months.  Zena Amos, MD 12/16/202110:00 AM

## 2020-10-30 ENCOUNTER — Telehealth (HOSPITAL_COMMUNITY): Payer: No Payment, Other | Admitting: Psychiatry

## 2020-12-04 ENCOUNTER — Other Ambulatory Visit: Payer: Self-pay

## 2020-12-04 ENCOUNTER — Encounter (HOSPITAL_COMMUNITY): Payer: Self-pay | Admitting: Psychiatry

## 2020-12-04 ENCOUNTER — Telehealth (INDEPENDENT_AMBULATORY_CARE_PROVIDER_SITE_OTHER): Payer: No Payment, Other | Admitting: Psychiatry

## 2020-12-04 DIAGNOSIS — F319 Bipolar disorder, unspecified: Secondary | ICD-10-CM

## 2020-12-04 MED ORDER — BUPROPION HCL ER (XL) 150 MG PO TB24: 150.0000 mg | ORAL_TABLET | ORAL | 2 refills | Status: AC

## 2020-12-04 MED ORDER — HYDROXYZINE HCL 25 MG PO TABS: 25.0000 mg | ORAL_TABLET | Freq: Two times a day (BID) | ORAL | 2 refills | Status: AC | PRN

## 2020-12-04 MED ORDER — OLANZAPINE 10 MG PO TABS: 10.0000 mg | ORAL_TABLET | Freq: Every day | ORAL | 2 refills | Status: AC

## 2020-12-04 NOTE — Progress Notes (Signed)
BH OP Progress Note    Virtual Visit via Telephone Note  I connected with Sean Sanford on 12/04/20 at  2:20 PM EST by telephone and verified that I am speaking with the correct person using two identifiers.  Location: Patient: home Provider: Clinic   I discussed the limitations, risks, security and privacy concerns of performing an evaluation and management service by telephone and the availability of in person appointments. I also discussed with the patient that there may be a patient responsible charge related to this service. The patient expressed understanding and agreed to proceed.   I provided 17 minutes of non-face-to-face time during this encounter.    Patient Identification: Sean Sanford MRN:  [id number] Date of Evaluation:  12/04/2020    Chief Complaint:   " I am doing a lot better."  Visit Diagnosis:    ICD-10-CM   1. Bipolar 1 disorder (HCC)  F31.9 hydrOXYzine (ATARAX/VISTARIL) 25 MG tablet    OLANZapine (ZYPREXA) 10 MG tablet    buPROPion (WELLBUTRIN XL) 150 MG 24 hr tablet    History of Present Illness: Patient reported he is doing much better now.  He has a stable job and overall things are progressing well for him.  He stated that fortunately most of his legal cases have been dismissed except for 1.  He stated that for that he would not have to be incarcerated and he will just get some probation time. He stated that his medications have been very helpful especially hydroxyzine and olanzapine which have helped him to stay balanced.  He found Wellbutrin to be helpful for his mood as well. He stated that he sometimes feels that as he feels that he took a long time to step up for himself and if he had taken these decisions earlier than he would have been able to handle things in a much better way. He stated that now he feels he is therefore his child and feels that overall things are falling in line for him.  He denied any other concerns or issues at this time and  requested refills.  He stated that he has requested the pharmacy to keep the refills on hold as he has plenty of medicine at home right now.   Past Psychiatric History: Bipolar disorder   Past Medical History:  Past Medical History:  Diagnosis Date  . Asthma   . MDD (major depressive disorder)   . Schizophrenia, acute (HCC)    No past surgical history on file.  Family Psychiatric History: denied  Family History: No family history on file.  Social History:   Social History   Socioeconomic History  . Marital status: Single    Spouse name: Not on file  . Number of children: Not on file  . Years of education: Not on file  . Highest education level: Not on file  Occupational History  . Not on file  Tobacco Use  . Smoking status: Current Every Day Smoker    Types: Cigars  . Smokeless tobacco: Never Used  Substance and Sexual Activity  . Alcohol use: Yes  . Drug use: Not Currently  . Sexual activity: Not on file  Other Topics Concern  . Not on file  Social History Narrative   ** Merged History Encounter **       Social Determinants of Health   Financial Resource Strain: Not on file  Food Insecurity: Not on file  Transportation Needs: Not on file  Physical Activity: Not on file  Stress: Not  on file  Social Connections: Not on file    Additional Social History: Working several odd jobs  Allergies:  No Known Allergies  Metabolic Disorder Labs: Lab Results  Component Value Date   HGBA1C 4.2 (L) 04/06/2019   MPG 73.84 04/06/2019   No results found for: PROLACTIN Lab Results  Component Value Date   CHOL 167 04/06/2019   TRIG 71 04/06/2019   HDL 82 04/06/2019   CHOLHDL 2.0 04/06/2019   VLDL 14 04/06/2019   LDLCALC NOT CALCULATED 04/06/2019   Lab Results  Component Value Date   TSH 1.248 04/06/2019    Therapeutic Level Labs: No results found for: LITHIUM No results found for: CBMZ No results found for: VALPROATE  Current Medications: Current  Outpatient Medications  Medication Sig Dispense Refill  . albuterol (VENTOLIN HFA) 108 (90 Base) MCG/ACT inhaler Inhale 2 puffs into the lungs every 6 (six) hours as needed for wheezing or shortness of breath. 1 Inhaler 2  . buPROPion (WELLBUTRIN XL) 150 MG 24 hr tablet Take 1 tablet (150 mg total) by mouth every morning. 30 tablet 2  . hydrocortisone (ANUSOL-HC) 2.5 % rectal cream Place 1 application rectally 2 (two) times daily. 30 g 0  . hydrOXYzine (ATARAX/VISTARIL) 25 MG tablet Take 1 tablet (25 mg total) by mouth 2 (two) times daily as needed for anxiety. 60 tablet 2  . OLANZapine (ZYPREXA) 10 MG tablet Take 1 tablet (10 mg total) by mouth at bedtime. 30 tablet 2   No current facility-administered medications for this visit.     Psychiatric Specialty Exam: Review of Systems  There were no vitals taken for this visit.There is no height or weight on file to calculate BMI.  General Appearance: unable to assess due to phone visit  Eye Contact:  unable to assess due to phone visit  Speech:  Clear and Coherent and Normal Rate  Volume:  Normal  Mood:  Euthymic  Affect:  Congruent  Thought Process:  Goal Directed and Descriptions of Associations: Intact  Orientation:  Full (Time, Place, and Person)  Thought Content:  Logical, denied any paranoid delusions  Suicidal Thoughts:  No  Homicidal Thoughts:  No  Memory:  Immediate;   Good Recent;   Good  Judgement:  Fair  Insight:  Fair  Psychomotor Activity:  Normal  Concentration:  Concentration: Fair and Attention Span: Fair  Recall:  Good  Fund of Knowledge:Good  Language: Good  Akathisia:  Negative  Handed:  Right  AIMS (if indicated):  not done  Assets:  Communication Skills Desire for Improvement Financial Resources/Insurance Housing Physical Health  ADL's:  Intact  Cognition: WNL  Sleep:  Good   Screenings: AIMS   Flowsheet Row Admission (Discharged) [facility]  AIMS Total Score 0    AUDIT   Flowsheet Row Admission (Discharged) from [facility]  Alcohol Use Disorder Identification Test Final Score (AUDIT) 0    Flowsheet Row Admission (Discharged) from [facility]  C-SSRS RISK CATEGORY Error: Question 6 not populated      Assessment and Plan: Patient appears to be doing well on his current regimen.  His mood has been stable.  His psychosocial stressors are somewhat relieved and he feels he is on the right track personally.  1. Bipolar 1 disorder (HCC)  - hydrOXYzine (ATARAX/VISTARIL) 25 MG tablet; Take 1 tablet (  25 mg total) by mouth 2 (two) times daily as needed for anxiety.  Dispense: 60 tablet; Refill: 2 - OLANZapine (ZYPREXA) 10 MG tablet; Take 1 tablet (10 mg total) by mouth at bedtime.  Dispense: 30 tablet; Refill: 2 - buPROPion (WELLBUTRIN XL) 150 MG 24 hr tablet; Take 1 tablet (150 mg total) by mouth every morning.  Dispense: 30 tablet; Refill: 2   Continue same medication regimen. Follow up in 3 months.  Zena Amos, MD 2/15/20222:22 PM

## 2020-12-20 ENCOUNTER — Ambulatory Visit: Payer: Medicaid - Out of State | Admitting: Internal Medicine

## 2021-02-12 ENCOUNTER — Other Ambulatory Visit: Payer: Self-pay

## 2021-02-12 ENCOUNTER — Encounter (HOSPITAL_COMMUNITY): Payer: Self-pay

## 2021-02-12 ENCOUNTER — Emergency Department (HOSPITAL_COMMUNITY)
Admission: EM | Admit: 2021-02-12 | Discharge: 2021-02-12 | Disposition: A | Discharge status: Home / Self Care | Payer: Medicaid - Out of State | Attending: Emergency Medicine | Admitting: Emergency Medicine

## 2021-02-12 DIAGNOSIS — J45909 Unspecified asthma, uncomplicated: Secondary | ICD-10-CM | POA: Insufficient documentation

## 2021-02-12 DIAGNOSIS — M62838 Other muscle spasm: Secondary | ICD-10-CM

## 2021-02-12 DIAGNOSIS — X500XXA Overexertion from strenuous movement or load, initial encounter: Secondary | ICD-10-CM | POA: Insufficient documentation

## 2021-02-12 DIAGNOSIS — S161XXA Strain of muscle, fascia and tendon at neck level, initial encounter: Secondary | ICD-10-CM | POA: Insufficient documentation

## 2021-02-12 DIAGNOSIS — F17290 Nicotine dependence, other tobacco product, uncomplicated: Secondary | ICD-10-CM | POA: Insufficient documentation

## 2021-02-12 DIAGNOSIS — F606 Avoidant personality disorder: Secondary | ICD-10-CM | POA: Insufficient documentation

## 2021-02-12 MED ORDER — LIDOCAINE 5 % EX PTCH: 1.0000 | MEDICATED_PATCH | CUTANEOUS | 0 refills | Status: AC

## 2021-02-12 MED ORDER — METHOCARBAMOL 500 MG PO TABS: 500.0000 mg | ORAL_TABLET | Freq: Two times a day (BID) | ORAL | 0 refills | Status: DC | PRN

## 2021-02-12 MED ORDER — KETOROLAC TROMETHAMINE 15 MG/ML IJ SOLN
15.0000 mg | Freq: Once | INTRAMUSCULAR | Status: AC
  Administered 2021-02-12: 15 mg via INTRAMUSCULAR
  Filled 2021-02-12: qty 1

## 2021-02-12 MED ORDER — LIDOCAINE 5 % EX PTCH
1.0000 | MEDICATED_PATCH | CUTANEOUS | Status: DC
  Administered 2021-02-12: 1 via TRANSDERMAL
  Filled 2021-02-12: qty 1

## 2021-02-12 NOTE — ED Triage Notes (Signed)
Pt reports neck pain since yesterday, no injury or trauma noted. No radiation in pain.

## 2021-02-12 NOTE — ED Provider Notes (Signed)
[facility] EMERGENCY DEPARTMENT Provider Note   CSN: [id number] Arrival date & time: 02/12/21  1030     History Chief Complaint  Patient presents with  . Neck Pain    Harveer Sadler is a 30 y.o. male who reports worsening neck pain since yesterday.  Patient states that he has shooting pains that started in his neck and his upper back that are worsened with movement.  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  Patient with history of similar presentation in the past which his primary care doctor in [place] diagnosed as muscle spasms.  He denies any numbness, tingling, weakness in his upper extremities. He endorses sensation that his neck and shoulders are very tight.  He denies any fevers or chills.  He is quite anxious about his presentation.  I have personally reviewed this patient's medical records.  Has history of schizophrenia and major depressive disorder, he has prescriptions for Atarax and Wellbutrin as well as Zyprexa from February of this year.  HPI     Past Medical History:  Diagnosis Date  . Asthma   . MDD (major depressive disorder)   . Schizophrenia, acute [facility])     Patient Active Problem List   Diagnosis Date Noted  . Bipolar 1 disorder (HCC) 12/04/2020  . Bipolar 1 disorder, mixed, mild (HCC) 07/17/2020  . 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 No pertinent surgical history.     No family history on file.  Social History   Tobacco Use  . Smoking status: Current Every Day Smoker    Types: Cigars  . Smokeless tobacco: Never Used  Substance Use Topics  . Alcohol use: Yes  . Drug use: Not Currently    Home Medications Prior to Admission medications   Medication Sig Start Date End  Date Taking? Authorizing Provider  lidocaine (LIDODERM) 5 % Place 1 patch onto the skin daily. Remove & Discard patch within 12 hours or as directed by MD 02/12/21  Yes Barri Neidlinger, Eugene Gavia, PA-C  methocarbamol (ROBAXIN) 500 MG tablet Take 1 tablet (500 mg total) by mouth 2 (two) times daily as needed for muscle spasms. 02/12/21  Yes Oluwademilade Kellett R, PA-C  albuterol (VENTOLIN HFA) 108 (90 Base) MCG/ACT inhaler Inhale 2 puffs into the lungs every 6 (six) hours as needed for wheezing or shortness of breath. 04/07/19   Malvin Johns, MD  buPROPion (WELLBUTRIN XL) 150 MG 24 hr tablet Take 1 tablet (150 mg total) by mouth every morning. 12/04/20   Zena Amos, MD  hydrocortisone (ANUSOL-HC) 2.5 % rectal cream Place 1 application rectally 2 (two) times daily. 06/21/20   Lorelee New, PA-C  hydrOXYzine (ATARAX/VISTARIL) 25 MG tablet Take 1 tablet (25 mg total) by mouth 2 (two) times daily as needed for anxiety. 12/04/20   Zena Amos, MD  OLANZapine (ZYPREXA) 10 MG tablet Take 1 tablet (10 mg total) by mouth at bedtime. 12/04/20   Zena Amos, MD    Allergies    Patient has no known allergies.  Review of Systems   Review of Systems  Constitutional: Negative.   HENT: Negative.   Eyes: Negative.   Respiratory: Negative.   Cardiovascular: Negative.   Gastrointestinal: Negative.   Genitourinary: Negative.  Musculoskeletal: Positive for myalgias and neck pain. Negative for neck stiffness.  Skin: Negative.   Neurological: Positive for headaches. Negative for dizziness, syncope, weakness and light-headedness.  Psychiatric/Behavioral: The patient is nervous/anxious.     Physical Exam Updated Vital Signs BP 126/75   Pulse 88   Temp 98.1 F (36.7 C) (Oral)   Resp 18   Ht 5\' 11"  (1.803 m)   Wt 77.1 kg   SpO2 100%   BMI 23.71 kg/m   Physical Exam Vitals and nursing note reviewed.  Constitutional:      Appearance: Normal appearance. He is normal weight. He is not toxic-appearing.   HENT:     Head: Normocephalic and atraumatic.     Nose: Nose normal.     Mouth/Throat:     Mouth: Mucous membranes are moist.     Pharynx: Oropharynx is clear. Uvula midline. No oropharyngeal exudate, posterior oropharyngeal erythema or uvula swelling.  Eyes:     General: Lids are normal. Vision grossly intact.        Right eye: No discharge.        Left eye: No discharge.     Extraocular Movements: Extraocular movements intact.     Conjunctiva/sclera: Conjunctivae normal.     Pupils: Pupils are equal, round, and reactive to light.  Neck:     Trachea: Trachea and phonation normal.     Meningeal: Brudzinski's sign and Kernig's sign absent.  Cardiovascular:     Rate and Rhythm: Normal rate and regular rhythm.     Pulses: Normal pulses.     Heart sounds: Normal heart sounds. No murmur heard.   Pulmonary:     Effort: Pulmonary effort is normal. No tachypnea, bradypnea, accessory muscle usage or respiratory distress.     Breath sounds: Normal breath sounds. No wheezing or rales.  Chest:     Chest wall: No mass, lacerations, deformity, swelling, tenderness, crepitus or edema.  Abdominal:     General: Bowel sounds are normal. There is no distension.     Palpations: Abdomen is soft.     Tenderness: There is no abdominal tenderness.  Musculoskeletal:        General: No deformity.     Cervical back: Normal range of motion and neck supple. Spasms and tenderness present. No rigidity, bony tenderness or crepitus. Pain with movement and muscular tenderness present. No spinous process tenderness. Normal range of motion.     Thoracic back: Spasms and tenderness present. No bony tenderness.     Lumbar back: Spasms present. No tenderness or bony tenderness. Negative right straight leg raise test and negative left straight leg raise test.       Back:     Right lower leg: No edema.     Left lower leg: No edema.     Comments: Symmetric strength and normal sensation in the upper extremities  bilaterally. Patient ambulatory in the ED.  Lymphadenopathy:     Cervical: No cervical adenopathy.  Skin:    General: Skin is warm and dry.     Findings: No rash.  Neurological:     General: No focal deficit present.     Mental Status: He is alert and oriented to person, place, and time. Mental status is at baseline.     Cranial Nerves: Cranial nerves are intact.     Sensory: Sensation is intact.     Motor: Motor function is intact.     Gait: Gait is intact.  Psychiatric:  Mood and Affect: Mood is anxious.     ED Results / Procedures / Treatments   Labs (all labs ordered are listed, but only abnormal results are displayed) Labs Reviewed - No data to display  EKG None  Radiology No results found.  Procedures Procedures   Medications Ordered in ED Medications  ketorolac (TORADOL) 15 MG/ML injection 15 mg (15 mg Intramuscular Given 02/12/21 1158)    ED Course  I have reviewed the triage vital signs and the nursing notes.  Pertinent labs & imaging results that were available during my care of the patient were reviewed by me and considered in my medical decision making (see chart for details).    MDM Rules/Calculators/A&P                         30 year old male presents with concern for neck tightness and pain as well as shoulder/back pain. Patient without history of IV drug use.  Differential diagnosis for this patient symptoms includes but is not limited to cervical radiculopathy, muscle spasm, acute musculoskeletal injury, meningitis, acute cord compression, herpes zoster, transverse myelitis, osteomyelitis of the spine, epidural abscess/hematoma.  Vital signs are normal intake.  Cardiopulmonary exam is normal, abdominal exam is benign.  HEENT exam is normal.  There is spasm and tenderness to palpation of the cervical and thoracic paraspinous musculature as well as the trapezius bilaterally without midline tenderness to palpation.  There are no meningeal signs on  physical exam, patient is afebrile.  He is otherwise well-appearing though he does appear anxious.  Full range of motion of the cervical spine though there is mild muscular pain elicited with movement.  Physical exam is very reassuring.  Doubt infectious etiology given intermittent nature of the pain, normal vital signs, and lack of systemic symptoms.  Doubt epidural abscess or hematoma as patient is low risk for epidural bleed or infection.  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  Physical exam and HPI most consistent with muscle spasm of the cervical and thoracic paraspinous musculature.  No further work-up warranted in the ED at this time.  Will offer Toradol and Lidoderm patch in the ED.  Will discharge with course of Robaxin and instructions to utilize topical analgesia such as Lidoderm patches, Biofreeze, IcyHot.  Recommend close follow-up with a primary care provider such as the [facility] and Wellness clinic.  Imraan voiced understanding of his medical evaluation and treatment plan.  Each of his questions was answered to his expressed satisfaction.  Return precautions were given.  Patient is well-appearing, stable, and appropriate for discharge at this time.  This chart was dictated using voice recognition software, Dragon. Despite the best efforts of this provider to proofread and correct errors, errors may still occur which can change documentation meaning.   Final Clinical Impression(s) / ED Diagnoses Final diagnoses:  Acute strain of neck muscle, initial encounter  Muscle spasms of neck    Rx / DC Orders ED Discharge Orders         Ordered    methocarbamol (ROBAXIN) 500 MG tablet  2 times daily PRN        02/12/21 1124    lidocaine (LIDODERM) 5 %  Every 24 hours        02/12/21 [address] Eugene Gavia, PA-C 02/12/21 1927    Pricilla Loveless, MD 02/13/21 [phone]

## 2021-02-12 NOTE — Discharge Instructions (Addendum)
You were seen in the emergency department today for your neck pain.  Your physical exam and vital signs are very reassuring.  The muscles in your neck and upper back are in what is called spasm, meaning they are inappropriately tightened up.  This can be quite painful.  To help with your pain you may take Tylenol and / or NSAID medication (such as ibuprofen or naproxen) to help with your pain.  Additionally, you have been prescribed a muscle relaxer called Robaxin to help relieve some of the muscle spasm.  Please be advised that this medication may make you very sleepy, so you should not drive or operate heavy machinery while you are taking it.  You may also utilize topical pain relief such as Biofreeze, IcyHot, or topical lidocaine patches.  I also recommend that you apply heat to the area, such as a hot shower or heating pad, and follow heat application with massage of the muscles that are most tight.  Please return to the emergency department if you develop any numbness/tingling/weakness in your arms or legs, any difficulty urinating, or urinary incontinence chest pain, shortness of breath, abdominal pain, nausea or vomiting that does not stop, or any other new severe symptoms.

## 2021-02-20 ENCOUNTER — Emergency Department (HOSPITAL_COMMUNITY): Payer: No Typology Code available for payment source

## 2021-02-20 ENCOUNTER — Encounter (HOSPITAL_COMMUNITY): Payer: Self-pay | Admitting: Emergency Medicine

## 2021-02-20 ENCOUNTER — Other Ambulatory Visit: Payer: Self-pay

## 2021-02-20 ENCOUNTER — Emergency Department (HOSPITAL_COMMUNITY)
Admission: EM | Admit: 2021-02-20 | Discharge: 2021-02-20 | Disposition: A | Discharge status: Home / Self Care | Payer: No Typology Code available for payment source | Attending: Emergency Medicine | Admitting: Emergency Medicine

## 2021-02-20 DIAGNOSIS — Y92410 Unspecified street and highway as the place of occurrence of the external cause: Secondary | ICD-10-CM | POA: Insufficient documentation

## 2021-02-20 DIAGNOSIS — M791 Myalgia, unspecified site: Secondary | ICD-10-CM | POA: Diagnosis not present

## 2021-02-20 DIAGNOSIS — M542 Cervicalgia: Secondary | ICD-10-CM | POA: Diagnosis not present

## 2021-02-20 DIAGNOSIS — F17290 Nicotine dependence, other tobacco product, uncomplicated: Secondary | ICD-10-CM | POA: Insufficient documentation

## 2021-02-20 DIAGNOSIS — J45909 Unspecified asthma, uncomplicated: Secondary | ICD-10-CM | POA: Diagnosis not present

## 2021-02-20 DIAGNOSIS — M25511 Pain in right shoulder: Secondary | ICD-10-CM | POA: Diagnosis not present

## 2021-02-20 DIAGNOSIS — M7918 Myalgia, other site: Secondary | ICD-10-CM

## 2021-02-20 DIAGNOSIS — Z79899 Other long term (current) drug therapy: Secondary | ICD-10-CM | POA: Insufficient documentation

## 2021-02-20 MED ORDER — METHOCARBAMOL 500 MG PO TABS: 500.0000 mg | ORAL_TABLET | Freq: Two times a day (BID) | ORAL | 0 refills | Status: DC

## 2021-02-20 MED ORDER — NAPROXEN 500 MG PO TABS: 500.0000 mg | ORAL_TABLET | Freq: Two times a day (BID) | ORAL | 0 refills | Status: AC

## 2021-02-20 NOTE — ED Notes (Signed)
Patient verbalizes understanding of discharge instructions. Opportunity for questioning and answers were provided. Armband removed by staff, pt discharged from ED ambulatory.   
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
## 2021-02-20 NOTE — Discharge Instructions (Addendum)
The xray of your shoulder and the ct scan of your neck did not show any broken bones or dislocations. Your pain is likely from continued muscle spasm.  You were given a prescription for Robaxin which is a muscle relaxer.  You should not drive, work, or operate machinery while taking this medication as it can make you very drowsy.    You may alternate taking Tylenol and Naproxen as needed for pain control. You may take Naproxen twice daily as directed on your discharge paperwork and you may take  [phone] mg of Tylenol every 6 hours. Do not exceed 4000 mg of Tylenol daily as this can lead to liver damage. Also, make sure to take Naproxen with meals as it can cause an upset stomach. Do not take other NSAIDs while taking Naproxen such as (Aleve, Ibuprofen, Aspirin, Celebrex, etc) and do not take more than the prescribed dose as this can lead to ulcers and bleeding in your GI tract. You may use warm and cold compresses to help with your symptoms.   Please follow up with your primary doctor within the next 7-10 days for re-evaluation and further treatment of your symptoms.   Please return to the ER sooner if you have any new or worsening symptoms.
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
## 2021-02-28 ENCOUNTER — Other Ambulatory Visit: Payer: Self-pay

## 2021-02-28 ENCOUNTER — Telehealth (HOSPITAL_COMMUNITY): Payer: No Payment, Other | Admitting: Psychiatry

## 2021-03-15 ENCOUNTER — Emergency Department (HOSPITAL_COMMUNITY)
Admission: EM | Admit: 2021-03-15 | Discharge: 2021-03-15 | Disposition: A | Discharge status: Home / Self Care | Payer: Medicaid - Out of State | Attending: Emergency Medicine | Admitting: Emergency Medicine

## 2021-03-15 ENCOUNTER — Other Ambulatory Visit: Payer: Self-pay

## 2021-03-15 ENCOUNTER — Encounter (HOSPITAL_COMMUNITY): Payer: Self-pay | Admitting: Emergency Medicine

## 2021-03-15 DIAGNOSIS — M62838 Other muscle spasm: Secondary | ICD-10-CM | POA: Insufficient documentation

## 2021-03-15 DIAGNOSIS — F17290 Nicotine dependence, other tobacco product, uncomplicated: Secondary | ICD-10-CM | POA: Insufficient documentation

## 2021-03-15 DIAGNOSIS — H6691 Otitis media, unspecified, right ear: Secondary | ICD-10-CM | POA: Insufficient documentation

## 2021-03-15 DIAGNOSIS — J45909 Unspecified asthma, uncomplicated: Secondary | ICD-10-CM | POA: Insufficient documentation

## 2021-03-15 DIAGNOSIS — H6690 Otitis media, unspecified, unspecified ear: Secondary | ICD-10-CM

## 2021-03-15 DIAGNOSIS — Z760 Encounter for issue of repeat prescription: Secondary | ICD-10-CM | POA: Insufficient documentation

## 2021-03-15 MED ORDER — AMOXICILLIN 500 MG PO CAPS: 500.0000 mg | ORAL_CAPSULE | Freq: Three times a day (TID) | ORAL | 0 refills | Status: AC

## 2021-03-15 MED ORDER — NAPROXEN 500 MG PO TABS: 500.0000 mg | ORAL_TABLET | Freq: Two times a day (BID) | ORAL | 0 refills | Status: AC

## 2021-03-15 MED ORDER — METHOCARBAMOL 500 MG PO TABS: 500.0000 mg | ORAL_TABLET | Freq: Two times a day (BID) | ORAL | 0 refills | Status: AC
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
## 2021-03-15 NOTE — ED Triage Notes (Signed)
Patient requesting prescription refill for his methocarbamol.

## 2021-03-22 ENCOUNTER — Encounter (HOSPITAL_COMMUNITY): Payer: Self-pay | Admitting: Emergency Medicine

## 2021-03-22 ENCOUNTER — Ambulatory Visit (HOSPITAL_COMMUNITY)
Admission: EM | Admit: 2021-03-22 | Discharge: 2021-03-22 | Disposition: A | Discharge status: Home / Self Care | Payer: Self-pay | Attending: Medical Oncology | Admitting: Medical Oncology

## 2021-03-22 ENCOUNTER — Ambulatory Visit (INDEPENDENT_AMBULATORY_CARE_PROVIDER_SITE_OTHER): Payer: Self-pay

## 2021-03-22 ENCOUNTER — Other Ambulatory Visit: Payer: Self-pay

## 2021-03-22 DIAGNOSIS — J4521 Mild intermittent asthma with (acute) exacerbation: Secondary | ICD-10-CM

## 2021-03-22 DIAGNOSIS — R062 Wheezing: Secondary | ICD-10-CM

## 2021-03-22 DIAGNOSIS — R059 Cough, unspecified: Secondary | ICD-10-CM

## 2021-03-22 MED ORDER — ALBUTEROL SULFATE HFA 108 (90 BASE) MCG/ACT IN AERS: 1.0000 | INHALATION_SPRAY | Freq: Four times a day (QID) | RESPIRATORY_TRACT | 0 refills | Status: AC | PRN

## 2021-03-22 MED ORDER — METHYLPREDNISOLONE SODIUM SUCC 125 MG IJ SOLR
INTRAMUSCULAR | Status: AC
  Filled 2021-03-22: qty 2

## 2021-03-22 MED ORDER — PREDNISONE 10 MG (21) PO TBPK: ORAL_TABLET | Freq: Every day | ORAL | 0 refills | Status: AC

## 2021-03-22 MED ORDER — METHYLPREDNISOLONE SODIUM SUCC 125 MG IJ SOLR
125.0000 mg | Freq: Once | INTRAMUSCULAR | Status: AC
  Administered 2021-03-22: 125 mg via INTRAMUSCULAR

## 2021-03-22 MED ORDER — ALBUTEROL SULFATE HFA 108 (90 BASE) MCG/ACT IN AERS: 2.0000 | INHALATION_SPRAY | Freq: Once | RESPIRATORY_TRACT | Status: DC

## 2021-03-22 NOTE — ED Provider Notes (Signed)
[facility]    CSN: [id number] Arrival date & time: 03/22/21  4174      History   Chief Complaint Chief Complaint  Patient presents with  . Cough  . Otalgia    Right  . Chest Congestion    HPI Sean Sanford is a 30 y.o. male.   HPI   Cough: Patient reports that a few days ago he started to have right ear pain and was seen virtually and started on amoxicillin for suspected ear infection.  He then developed a wheeze like cough and flare of his asthma.  He states that he has been using his inhaler which helps initially but also reports he is having some productivity of the cough and that this asthma flare feels a bit different than his normal asthma flares.  He states that his asthma is normally well controlled and does not have to use his inhaler very often.  He reports that he has never been hospitalized for asthma flares in the past.  No known history of pneumonia.  He does smoke.  No recent fevers, significant shortness of breath and no chest pain.  No known sick contacts. Past Medical History:  Diagnosis Date  . Asthma   . MDD (major depressive disorder)   . Schizophrenia, acute [facility])     Patient Active Problem List   Diagnosis Date Noted  . Bipolar 1 disorder (HCC) 12/04/2020  . Bipolar 1 disorder, mixed, mild (HCC) 07/17/2020  . 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 No pertinent surgical history.     Home Medications    Prior to Admission medications   Medication Sig Start Date End Date Taking? Authorizing Provider  albuterol (VENTOLIN HFA) 108 (90 Base) MCG/ACT inhaler Inhale 2 puffs into the lungs every 6 (six) hours as needed for wheezing or shortness of breath. 04/07/19   Malvin Johns, MD  amoxicillin (AMOXIL) 500 MG capsule Take 1 capsule (500 mg total) by mouth 3 (three) times daily for 7 days. 03/15/21 03/22/21  Henderly, Britni A, PA-C  buPROPion (WELLBUTRIN XL) 150 MG 24 hr tablet  Take 1 tablet (150 mg total) by mouth every morning. 12/04/20   Zena Amos, MD  hydrocortisone (ANUSOL-HC) 2.5 % rectal cream Place 1 application rectally 2 (two) times daily. 06/21/20   Lorelee New, PA-C  hydrOXYzine (ATARAX/VISTARIL) 25 MG tablet Take 1 tablet (25 mg total) by mouth 2 (two) times daily as needed for anxiety. 12/04/20   Zena Amos, MD  lidocaine (LIDODERM) 5 % Place 1 patch onto the skin daily. Remove & Discard patch within 12 hours or as directed by MD 02/12/21   Sponseller, Eugene Gavia, PA-C  methocarbamol (ROBAXIN) 500 MG tablet Take 1 tablet (500 mg total) by mouth 2 (two) times daily. 03/15/21   Henderly, Britni A, PA-C  naproxen (NAPROSYN) 500 MG tablet Take 1 tablet (500 mg total) by mouth 2 (two) times daily. 03/15/21   Henderly, Britni A, PA-C  OLANZapine (ZYPREXA) 10 MG tablet Take 1 tablet (10 mg total) by mouth at bedtime. 12/04/20   Zena Amos, MD    Family History History reviewed. No pertinent family history.  Social History Social History   Tobacco Use  . Smoking status: Current Every Day Smoker    Types: Cigars  . Smokeless tobacco: Never Used  Substance Use Topics  . Alcohol use: Yes  . Drug use: Not Currently     Allergies   Patient has  no known allergies.   Review of Systems Review of Systems  As stated above in HPI Physical Exam Triage Vital Signs ED Triage Vitals  Enc Vitals Group     BP 03/22/21 0855 (!) 110/59     Pulse Rate 03/22/21 0855 86     Resp 03/22/21 0855 17     Temp 03/22/21 0855 97.8 F (36.6 C)     Temp Source 03/22/21 0855 Oral     SpO2 03/22/21 0855 96 %     Weight --      Height --      Head Circumference --      Peak Flow --      Pain Score 03/22/21 0853 3     Pain Loc --      Pain Edu? --      Excl. in GC? --    No data found.  Updated Vital Signs BP (!) 110/59 (BP Location: Right Arm)   Pulse 86   Temp 97.8 F (36.6 C) (Oral)   Resp 17   SpO2 96%   Physical Exam Vitals and nursing note  reviewed.  Constitutional:      General: He is not in acute distress.    Appearance: Normal appearance. He is not ill-appearing, toxic-appearing or diaphoretic.  HENT:     Head: Normocephalic and atraumatic.     Right Ear: Tympanic membrane, ear canal and external ear normal.     Left Ear: Tympanic membrane, ear canal and external ear normal.     Nose: Nose normal.     Mouth/Throat:     Mouth: Mucous membranes are moist.     Pharynx: Oropharynx is clear. No oropharyngeal exudate or posterior oropharyngeal erythema.  Eyes:     Extraocular Movements: Extraocular movements intact.     Pupils: Pupils are equal, round, and reactive to light.  Cardiovascular:     Rate and Rhythm: Normal rate and regular rhythm.     Heart sounds: Normal heart sounds.  Pulmonary:     Effort: Pulmonary effort is normal.     Breath sounds: Wheezing present.  Musculoskeletal:     Cervical back: Normal range of motion. No rigidity or tenderness.  Lymphadenopathy:     Cervical: No cervical adenopathy.  Skin:    General: Skin is warm.  Neurological:     Mental Status: He is alert and oriented to person, place, and time.  Psychiatric:        Mood and Affect: Mood normal.        Behavior: Behavior normal.        Thought Content: Thought content normal.        Judgment: Judgment normal.      UC Treatments / Results  Labs (all labs ordered are listed, but only abnormal results are displayed) Labs Reviewed - No data to display  EKG   Radiology No results found.  Procedures Procedures (including critical care time)  Medications Ordered in UC Medications  methylPREDNISolone sodium succinate (SOLU-MEDROL) 125 mg/2 mL injection 125 mg (125 mg Intramuscular Given 03/22/21 0906)    Initial Impression / Assessment and Plan / UC Course  I have reviewed the triage vital signs and the nursing notes.  Pertinent labs & imaging results that were available during my care of the patient were reviewed by me  and considered in my medical decision making (see chart for details).     New.  Appears to be asthma exacerbation but given the productivity of cough and  that symptoms do not follow his typical asthma flares we are going to obtain a chest x-ray to ensure no sign of pneumonia.   UPDATE: Chest x-ray is normal.  Treating for asthma exacerbation with albuterol, prednisone starting tomorrow and close observationto prevent further systemic illness. Discussed red flag signs and symptoms.  Final Clinical Impressions(s) / UC Diagnoses   Final diagnoses:  None   Discharge Instructions   None    ED Prescriptions    None     PDMP not reviewed this encounter.   Rushie Chestnut, [place] 03/22/21 1044

## 2021-03-22 NOTE — ED Triage Notes (Signed)
Pt presents with right ear pain, chest tightness and congestion xs 1-2 weeks. States was given amoxicillin by PCP but has not completed. States does not feel is working.
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
